# Patient Record
Sex: Male | Born: 1970 | Race: White | Hispanic: Yes | Smoking: Never smoker
Health system: Southern US, Community
[De-identification: ages and names within clinical notes are randomized; demographics above are authoritative.]

## PROBLEM LIST (undated history)

## (undated) DIAGNOSIS — J309 Allergic rhinitis, unspecified: Secondary | ICD-10-CM

## (undated) DIAGNOSIS — R011 Cardiac murmur, unspecified: Secondary | ICD-10-CM

## (undated) DIAGNOSIS — Z8719 Personal history of other diseases of the digestive system: Secondary | ICD-10-CM

## (undated) DIAGNOSIS — R7303 Prediabetes: Secondary | ICD-10-CM

## (undated) HISTORY — PX: NO PAST SURGERIES: SHX2092

---

## 2012-03-30 ENCOUNTER — Emergency Department: Payer: Self-pay | Admitting: Emergency Medicine

## 2012-03-30 LAB — CBC WITH DIFFERENTIAL/PLATELET
Basophil #: 0.1 10*3/uL (ref 0.0–0.1)
Basophil %: 0.7 %
Eosinophil #: 0 10*3/uL (ref 0.0–0.7)
HCT: 48.1 % (ref 40.0–52.0)
HGB: 16.3 g/dL (ref 13.0–18.0)
Lymphocyte %: 8.7 %
MCHC: 33.9 g/dL (ref 32.0–36.0)
MCV: 84 fL (ref 80–100)
Monocyte #: 0.6 x10 3/mm (ref 0.2–1.0)
Neutrophil %: 86.3 %
Platelet: 300 10*3/uL (ref 150–440)
RBC: 5.74 10*6/uL (ref 4.40–5.90)
RDW: 14.2 % (ref 11.5–14.5)

## 2012-03-30 LAB — COMPREHENSIVE METABOLIC PANEL
Alkaline Phosphatase: 89 U/L (ref 50–136)
BUN: 13 mg/dL (ref 7–18)
Bilirubin,Total: 0.6 mg/dL (ref 0.2–1.0)
Calcium, Total: 8.5 mg/dL (ref 8.5–10.1)
Chloride: 104 mmol/L (ref 98–107)
Co2: 24 mmol/L (ref 21–32)
Creatinine: 0.79 mg/dL (ref 0.60–1.30)
EGFR (African American): >60
EGFR (Non-African Amer.): >60
Glucose: 93 mg/dL (ref 65–99)
Osmolality: 275 (ref 275–301)
Potassium: 4 mmol/L (ref 3.5–5.1)
SGOT(AST): 26 U/L (ref 15–37)
SGPT (ALT): 53 U/L (ref 12–78)
Sodium: 138 mmol/L (ref 136–145)

## 2018-04-14 ENCOUNTER — Inpatient Hospital Stay
Admission: EM | Admit: 2018-04-14 | Discharge: 2018-04-17 | DRG: 392 | Disposition: A | Discharge status: Home / Self Care | Payer: BLUE CROSS/BLUE SHIELD | Attending: Surgery | Admitting: Surgery

## 2018-04-14 ENCOUNTER — Other Ambulatory Visit: Payer: Self-pay

## 2018-04-14 ENCOUNTER — Encounter: Payer: Self-pay | Admitting: Emergency Medicine

## 2018-04-14 DIAGNOSIS — K5720 Diverticulitis of large intestine with perforation and abscess without bleeding: Secondary | ICD-10-CM | POA: Diagnosis not present

## 2018-04-14 DIAGNOSIS — R1032 Left lower quadrant pain: Secondary | ICD-10-CM | POA: Diagnosis not present

## 2018-04-14 DIAGNOSIS — K5792 Diverticulitis of intestine, part unspecified, without perforation or abscess without bleeding: Secondary | ICD-10-CM

## 2018-04-14 LAB — URINALYSIS, COMPLETE (UACMP) WITH MICROSCOPIC
BILIRUBIN URINE: NEGATIVE
Bacteria, UA: NONE SEEN
Glucose, UA: 50 mg/dL — AB
Hgb urine dipstick: NEGATIVE
Ketones, ur: NEGATIVE mg/dL
Leukocytes,Ua: NEGATIVE
Nitrite: NEGATIVE
Protein, ur: NEGATIVE mg/dL
Specific Gravity, Urine: 1.028 (ref 1.005–1.030)
pH: 5 (ref 5.0–8.0)

## 2018-04-14 LAB — CBC
HCT: 44 % (ref 39.0–52.0)
HEMOGLOBIN: 15.1 g/dL (ref 13.0–17.0)
MCH: 28.7 pg (ref 26.0–34.0)
MCHC: 34.3 g/dL (ref 30.0–36.0)
MCV: 83.7 fL (ref 80.0–100.0)
Platelets: 299 10*3/uL (ref 150–400)
RBC: 5.26 MIL/uL (ref 4.22–5.81)
RDW: 11.9 % (ref 11.5–15.5)
WBC: 9.1 10*3/uL (ref 4.0–10.5)
nRBC: 0 % (ref 0.0–0.2)

## 2018-04-14 LAB — COMPREHENSIVE METABOLIC PANEL
ALBUMIN: 4.1 g/dL (ref 3.5–5.0)
ALT: 26 U/L (ref 0–44)
AST: 22 U/L (ref 15–41)
Alkaline Phosphatase: 85 U/L (ref 38–126)
Anion gap: 8 (ref 5–15)
BUN: 14 mg/dL (ref 6–20)
CHLORIDE: 107 mmol/L (ref 98–111)
CO2: 24 mmol/L (ref 22–32)
Calcium: 8.9 mg/dL (ref 8.9–10.3)
Creatinine, Ser: 0.88 mg/dL (ref 0.61–1.24)
GFR calc Af Amer: >60 mL/min (ref >60)
GFR calc non Af Amer: >60 mL/min (ref >60)
Glucose, Bld: 128 mg/dL — ABNORMAL HIGH (ref 70–99)
POTASSIUM: 3.6 mmol/L (ref 3.5–5.1)
Sodium: 139 mmol/L (ref 135–145)
Total Bilirubin: 0.2 mg/dL — ABNORMAL LOW (ref 0.3–1.2)
Total Protein: 7.3 g/dL (ref 6.5–8.1)

## 2018-04-14 LAB — LIPASE, BLOOD: Lipase: 25 U/L (ref 11–51)

## 2018-04-14 MED ORDER — SODIUM CHLORIDE 0.9% FLUSH: 3.0000 mL | Freq: Once | INTRAVENOUS | Status: DC

## 2018-04-14 NOTE — ED Triage Notes (Signed)
Pt reports went to urgent for abdominal pain LLQ since last Saturday, reports feeling burning sensation and at times has severe abdominal cramping to LLQ. Pt reports has had diarrhea since last Friday denies any nausea vomiting, pt talks in complete sentences no distress noted.

## 2018-04-15 ENCOUNTER — Encounter: Payer: Self-pay | Admitting: Emergency Medicine

## 2018-04-15 ENCOUNTER — Emergency Department: Payer: BLUE CROSS/BLUE SHIELD

## 2018-04-15 DIAGNOSIS — K5720 Diverticulitis of large intestine with perforation and abscess without bleeding: Secondary | ICD-10-CM | POA: Diagnosis present

## 2018-04-15 DIAGNOSIS — R1032 Left lower quadrant pain: Secondary | ICD-10-CM | POA: Diagnosis present

## 2018-04-15 MED ORDER — METRONIDAZOLE IN NACL 5-0.79 MG/ML-% IV SOLN
500.0000 mg | Freq: Three times a day (TID) | INTRAVENOUS | Status: DC
  Administered 2018-04-15 – 2018-04-17 (×7): 500 mg via INTRAVENOUS
  Filled 2018-04-15 (×8): qty 100

## 2018-04-15 MED ORDER — OXYCODONE HCL 5 MG PO TABS: 5.0000 mg | ORAL_TABLET | ORAL | Status: DC | PRN

## 2018-04-15 MED ORDER — SODIUM CHLORIDE 0.9 % IV SOLN: 1.0000 g | INTRAVENOUS | Status: DC

## 2018-04-15 MED ORDER — ENOXAPARIN SODIUM 40 MG/0.4ML ~~LOC~~ SOLN
40.0000 mg | SUBCUTANEOUS | Status: DC
  Administered 2018-04-15 – 2018-04-16 (×2): 40 mg via SUBCUTANEOUS
  Filled 2018-04-15 (×2): qty 0.4

## 2018-04-15 MED ORDER — ONDANSETRON HCL 4 MG/2ML IJ SOLN: 4.0000 mg | Freq: Four times a day (QID) | INTRAMUSCULAR | Status: DC | PRN

## 2018-04-15 MED ORDER — IOHEXOL 300 MG/ML  SOLN
100.0000 mL | Freq: Once | INTRAMUSCULAR | Status: AC | PRN
  Administered 2018-04-15: 100 mL via INTRAVENOUS

## 2018-04-15 MED ORDER — SODIUM CHLORIDE 0.9 % IV SOLN
2.0000 g | INTRAVENOUS | Status: DC
  Administered 2018-04-16 – 2018-04-17 (×2): 2 g via INTRAVENOUS
  Filled 2018-04-15 (×2): qty 2

## 2018-04-15 MED ORDER — MORPHINE SULFATE (PF) 2 MG/ML IV SOLN: 2.0000 mg | INTRAVENOUS | Status: DC | PRN

## 2018-04-15 MED ORDER — ACETAMINOPHEN 650 MG RE SUPP: 650.0000 mg | Freq: Four times a day (QID) | RECTAL | Status: DC | PRN

## 2018-04-15 MED ORDER — PIPERACILLIN-TAZOBACTAM 3.375 G IVPB 30 MIN
3.3750 g | Freq: Once | INTRAVENOUS | Status: AC
  Administered 2018-04-15: 3.375 g via INTRAVENOUS
  Filled 2018-04-15: qty 50

## 2018-04-15 MED ORDER — ONDANSETRON 4 MG PO TBDP: 4.0000 mg | ORAL_TABLET | Freq: Four times a day (QID) | ORAL | Status: DC | PRN

## 2018-04-15 MED ORDER — ACETAMINOPHEN 325 MG PO TABS: 650.0000 mg | ORAL_TABLET | Freq: Four times a day (QID) | ORAL | Status: DC | PRN

## 2018-04-15 MED ORDER — DEXTROSE IN LACTATED RINGERS 5 % IV SOLN
INTRAVENOUS | Status: DC
  Administered 2018-04-15 – 2018-04-16 (×2): via INTRAVENOUS

## 2018-04-15 NOTE — H&P (Signed)
SURGICAL HISTORY & PHYSICAL (cpt 99222)  HISTORY OF PRESENT ILLNESS (HPI): (obtained with the assistance of certified medical Spanish-English translator) 47 y.o. male presented to [facility] ED overnight for abdominal pain. Patient reports he experienced acute onset of severe LLQ abdominal pain 1 week ago, this past Friday 3/13, which continued with nausea, though no emesis, until Sunday 3/15. He says he'd experienced similar this past December, which ultimately resolved without intervention, so he again waited for it to resolve similarly this time as well. By Monday, his severe pain and nausea resolved until he then began to develop a different sensation of LLQ and supra-pubic abdominal "burning" with new onset of diarrhea beginning this past Wednesday 3/18. He says the "burning" was not as bad as his initial LLQ abdominal pain, but both his "burning" and diarrhea continued to worsen instead of resolving, prompting him to seek evaluation by urgent care, who referred him to [facility] ED. Patient denies fever, emesis, blood per rectum, or unintentional weight loss and says his diarrhea and pain have improved this morning. Patient adds that he also experienced a third similar episode of sigmoid colonic diverticulitis without abscess 5 years ago. He denies a history of constipation, denies a family history of colon cancer, and at <50 years old has not previously underwent screening colonoscopy.   PAST MEDICAL HISTORY (PMH):  History reviewed. No pertinent past medical history.  Reviewed. Otherwise negative.   PAST SURGICAL HISTORY (PSH):  History reviewed. No pertinent surgical history.  Reviewed. Otherwise negative.   MEDICATIONS:  Prior to Admission medications   Medication Sig Start Date End Date Taking? Authorizing Provider  benzonatate (TESSALON) 200 MG capsule Take 200 mg by mouth 3 (three) times daily as needed for cough. 04/14/18  Yes [provider]  brompheniramine-pseudoephedrine-DM 30-2-10  MG/5ML syrup Take 10 mLs by mouth 3 (three) times daily as needed for cough. 11/08/17  Yes [provider]   ALLERGIES:  Not on File   SOCIAL HISTORY:  Social History   Socioeconomic History  . Marital status: Unknown    Spouse name: Not on file  . Number of children: Not on file  . Years of education: Not on file  . Highest education level: Not on file  Occupational History  . Not on file  Social Needs  . Financial resource strain: Not on file  . Food insecurity:    Worry: Not on file    Inability: Not on file  . Transportation needs:    Medical: Not on file    Non-medical: Not on file  Tobacco Use  . Smoking status: Never Smoker  . Smokeless tobacco: Never Used  Substance and Sexual Activity  . Alcohol use: Yes    Alcohol/week: 4.0 standard drinks    Types: 4 Cans of beer per week    Comment: Weekly   . Drug use: Not on file  . Sexual activity: Not on file  Lifestyle  . Physical activity:    Days per week: Not on file    Minutes per session: Not on file  . Stress: Not on file  Relationships  . Social connections:    Talks on phone: Not on file    Gets together: Not on file    Attends religious service: Not on file    Active member of club or organization: Not on file    Attends meetings of clubs or organizations: Not on file    Relationship status: Not on file  . Intimate partner violence:      Fear of current or ex partner: Not on file    Emotionally abused: Not on file    Physically abused: Not on file    Forced sexual activity: Not on file  Other Topics Concern  . Not on file  Social History Narrative  . Not on file    The patient currently resides (home / rehab facility / nursing home): Home The patient normally is (ambulatory / bedbound): Ambulatory  FAMILY HISTORY:  History reviewed. No pertinent family history.  Otherwise negative.   REVIEW OF SYSTEMS:  Constitutional: denies any other weight loss, fever, chills, or sweats  Eyes:  denies any other vision changes, history of eye injury  ENT: denies sore throat, hearing problems  Respiratory: denies shortness of breath, wheezing  Cardiovascular: denies chest pain, palpitations  Gastrointestinal: abdominal pain, N/V, and bowel function as per HPI  Genitourinary: denies burning with urination or urinary frequency Musculoskeletal: denies any other joint pains or cramps  Skin: Denies any other rashes or skin discolorations  Neurological: denies any other headache, dizziness, weakness  Psychiatric: denies any other depression, anxiety   All other review of systems were otherwise negative.  VITAL SIGNS:  Temp:  [97.6 F (36.4 C)-98.5 F (36.9 C)] 97.8 F (36.6 C) (03/21 0858) Pulse Rate:  [51-64] 51 (03/21 0858) Resp:  [16-20] 16 (03/21 0858) BP: (111-146)/(74-101) 120/74 (03/21 0858) SpO2:  [97 %-100 %] 100 % (03/21 0858) Weight:  [70.3 kg] 70.3 kg (03/20 2002)     Height: 5' (152.4 cm) Weight: 70.3 kg BMI (Calculated): 30.27   INTAKE/OUTPUT:  This shift: No intake/output data recorded.  Last 2 shifts: @IOLAST2SHIFTS@  PHYSICAL EXAM:  Constitutional:  -- Normal body habitus  -- Awake, alert, and oriented x3, no apparent distress Eyes:  -- Pupils equally round and reactive to light  -- No scleral icterus, B/L no occular discharge Ear, nose, throat: -- Neck is FROM WNL -- No jugular venous distension  Pulmonary:  -- No wheezes or rhales -- Equal breath sounds bilaterally -- Breathing non-labored at rest Cardiovascular:  -- S1, S2 present  -- No pericardial rubs  Gastrointestinal:  -- Abdomen soft and non-distended with mild LLQ abdominal tenderness to palpation, no guarding or rebound tenderness -- No abdominal masses appreciated, pulsatile or otherwise  Musculoskeletal and Integumentary:  -- Wounds or skin discoloration: None appreciated -- Extremities: B/L UE and LE FROM, hands and feet warm, no edema  Neurologic:  -- Motor function: Intact and  symmetric -- Sensation: Intact and symmetric Psychiatric:  -- Mood and affect WNL  Labs:  CBC Latest Ref Rng & Units 04/14/2018 03/30/2012  WBC 4.0 - 10.5 K/uL 9.1 15.4(H)  Hemoglobin 13.0 - 17.0 g/dL 15.1 16.3  Hematocrit 39.0 - 52.0 % 44.0 48.1  Platelets 150 - 400 K/uL 299 300   CMP Latest Ref Rng & Units 04/14/2018 03/30/2012  Glucose 70 - 99 mg/dL 128(H) 93  BUN 6 - 20 mg/dL 14 13  Creatinine 0.61 - 1.24 mg/dL 0.88 0.79  Sodium 135 - 145 mmol/L 139 138  Potassium 3.5 - 5.1 mmol/L 3.6 4.0  Chloride 98 - 111 mmol/L 107 104  CO2 22 - 32 mmol/L 24 24  Calcium 8.9 - 10.3 mg/dL 8.9 8.5  Total Protein 6.5 - 8.1 g/dL 7.3 7.7  Total Bilirubin 0.3 - 1.2 mg/dL 0.2(L) 0.6  Alkaline Phos 38 - 126 U/L 85 89  AST 15 - 41 U/L 22 26  ALT 0 - 44 U/L 26 53   Imaging   studies:  CT Abdomen and Pelvis with Contrast (04/15/2018) - personally reviewed and discussed with patient and his daughter Normal appendix. Diverticulosis of sigmoid colon with sigmoid wall thickening and pericolic inflammatory changes compatible with acute diverticulitis. Small fluid collection measuring 9 x 15 x 8 mm mm in size with surrounding wall thickening and enhancement identified adjacent to the sigmoid colon and extending to the dome of the urinary bladder consistent with small diverticular abscess. Associated wall thickening of the adjacent bladder dome. No additional extraluminal fluid collections. No extraluminal gas or free intraperitoneal air. Stomach and remaining bowel loops unremarkable.  Small LEFT inguinal hernia containing fat. No free air or free fluid. Tiny umbilical hernia containing fat.  Assessment/Plan: (ICD-10's: K57.20) 47 y.o. 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               - clear liquids diet, IV fluids             - pain control prn, minimize narcotics              - IV antibiotics  (continue ceftriaxone and metronidazole)             - when tolerating PO, will need to maintain hydration + initially low fiber x 6 weeks, then high fiber diet             - elective outpatient elective sigmoid colectomy discussed considering multiple recurrences and no modifiable risk factors (constipation) to reduce risk for subsequent episodes of sigmoid colonic diverticulitis             - possibility of surgery with partial colectomy and likely colostomy also discussed if doesn't improve/resolve with antibiotics             - will admit to surgical service and continue to monitor abdominal exam and bowel function  - anticipate advancement of diet tomorrow and likely discharge home Monday, 3/23             - DVT prophylaxis, ambulation encouraged  All of the above findings and recommendations were discussed with the patient and his daughter, and all of his and family's questions were answered to their expressed satisfaction via certified medical Spanish-English translator service.  -- Jason E. Davis, MD, RPVI [place]: [facility] General Surgery - Partnering for exceptional care. Office: [phone] 

## 2018-04-15 NOTE — ED Provider Notes (Signed)
[facility] Emergency Department Provider Note  ____________________________________________   First MD Initiated Contact with Patient 04/15/18 0127     (approximate)  I have reviewed the triage vital signs and the nursing notes.   HISTORY  Chief Complaint Abdominal Pain (LLQ)  The patient and/or family speak(s) Spanish.  They understand they have the right to the use of a hospital interpreter, however at this time they prefer to speak directly with me in Spanish.  They know that they can ask for an interpreter at any time.   HPI Jeffrey Charles is a 48 y.o. male who reports no chronic medical issues and takes no medications every day presents for evaluation of lower abdominal pain and diarrhea.  He reports that the pain started last week and felt like a burning left lower quadrant abdominal pain that was occasionally sharp.  This continued for a couple days but then completely resolved for several days.  The pain started up again about 3 days ago and has been more or less constant.  Nothing in particular makes it better or worse but the pain does come and go.  Now it mostly feels like a burning sensation and "inflammation" all throughout his lower abdomen.  He has no upper abdominal pain.  He has had no nausea nor vomiting but has been having a lot of loose nonbloody stools.  He denies fever/chills, chest pain, shortness of breath.  He reports a history of "gastritis" but he takes no medications.  He drinks a few beers on the weekend and says that the pain does seem to be worse after drinking but it is all in his lower abdomen.  He has not been drinking heavily recently.  He does not smoke.  He was referred here from urgent care for further evaluation.         History reviewed. No pertinent past medical history.  There are no active problems to display for this patient.   History reviewed. No pertinent surgical history.  Prior to Admission  medications   Medication Sig Start Date End Date Taking? Authorizing Provider  benzonatate (TESSALON) 200 MG capsule Take 200 mg by mouth 3 (three) times daily as needed for cough. 04/14/18  Yes [provider]  brompheniramine-pseudoephedrine-DM 30-2-10 MG/5ML syrup Take 10 mLs by mouth 3 (three) times daily as needed for cough. 11/08/17  Yes [provider]    Allergies Patient has no allergy information on record.  History reviewed. No pertinent family history.  Social History Social History   Tobacco Use  . Smoking status: Never Smoker  . Smokeless tobacco: Never Used  Substance Use Topics  . Alcohol use: Yes    Alcohol/week: 4.0 standard drinks    Types: 4 Cans of beer per week    Comment: Weekly   . Drug use: Not on file    Review of Systems Constitutional: No fever/chills Eyes: No visual changes. ENT: No sore throat. Cardiovascular: Denies chest pain. Respiratory: Denies shortness of breath. Gastrointestinal: Lower abdominal pain and diarrhea as described above.  No nausea nor vomiting. Genitourinary: Negative for dysuria. Musculoskeletal: Negative for neck pain.  Negative for back pain. Integumentary: Negative for rash. Neurological: Negative for headaches, focal weakness or numbness.   ____________________________________________   PHYSICAL EXAM:  VITAL SIGNS: ED Triage Vitals [04/14/18 2002]  Enc Vitals Group     BP (!) 146/101     Pulse Rate 64     Resp 20     Temp 98.5  F (36.9 C)     Temp Source Oral     SpO2 98 %     Weight 70.3 kg (155 lb)     Height 1.524 m (5')     Head Circumference      Peak Flow      Pain Score 3     Pain Loc      Pain Edu?      Excl. in GC?     Constitutional: Alert and oriented. Well appearing and in no acute distress. Eyes: Conjunctivae are normal.  Head: Atraumatic. Nose: No congestion/rhinnorhea. Mouth/Throat: Mucous membranes are moist. Neck: No stridor.  No meningeal signs.    Cardiovascular: Normal rate, regular rhythm. Good peripheral circulation. Grossly normal heart sounds. Respiratory: Normal respiratory effort.  No retractions. Lungs CTAB. Gastrointestinal: Soft and non-distended.  Mild tenderness to palpation of the left lower quadrant.  No peritonitis. Musculoskeletal: No lower extremity tenderness nor edema. No gross deformities of extremities. Neurologic:  Normal speech and language. No gross focal neurologic deficits are appreciated.  Skin:  Skin is warm, dry and intact. No rash noted. Psychiatric: Mood and affect are normal. Speech and behavior are normal.  ____________________________________________   LABS (all labs ordered are listed, but only abnormal results are displayed)  Labs Reviewed  COMPREHENSIVE METABOLIC PANEL - Abnormal; Notable for the following components:      Result Value   Glucose, Bld 128 (*)    Total Bilirubin 0.2 (*)    All other components within normal limits  URINALYSIS, COMPLETE (UACMP) WITH MICROSCOPIC - Abnormal; Notable for the following components:   Color, Urine YELLOW (*)    APPearance CLEAR (*)    Glucose, UA 50 (*)    All other components within normal limits  LIPASE, BLOOD  CBC   ____________________________________________  EKG  No indication for EKG ____________________________________________  RADIOLOGY   ED MD interpretation: Acute diverticulitis with a small diverticular abscess  Official radiology report(s): Ct Abdomen Pelvis W Contrast  Result Date: 04/15/2018 CLINICAL DATA:  LEFT lower quadrant pain since last Saturday, burning sensation, severe cramping at times, diarrhea since last Friday EXAM: CT ABDOMEN AND PELVIS WITH CONTRAST TECHNIQUE: Multidetector CT imaging of the abdomen and pelvis was performed using the standard protocol following bolus administration of intravenous contrast. Sagittal and coronal MPR images reconstructed from axial data set. CONTRAST:  OMNIPAQUE IOHEXOL 300  MG/ML SOLN IV. No oral contrast. COMPARISON:  03/30/2012 FINDINGS: Lower chest: Calcified granuloma laterally at RIGHT lung base. Lung bases otherwise clear. Hepatobiliary: Mild fatty infiltration of liver. Liver and gallbladder otherwise normal appearance. Pancreas: Normal appearance Spleen: Normal appearance Adrenals/Urinary Tract: Tiny cyst inferior pole RIGHT kidney. Adrenal glands, kidneys, ureters, and bladder normal appearance Stomach/Bowel: Normal appendix. Diverticulosis of sigmoid colon with sigmoid wall thickening and pericolic inflammatory changes compatible with acute diverticulitis. Small fluid collection measuring 9 x 15 x 8 mm mm in size with surrounding wall thickening and enhancement identified adjacent to the sigmoid colon and extending to the dome of the urinary bladder consistent with small diverticular abscess. Associated wall thickening of the adjacent bladder dome. No additional extraluminal fluid collections. No extraluminal gas or free intraperitoneal air. Stomach and remaining bowel loops unremarkable. Vascular/Lymphatic: Aorta normal caliber.  No adenopathy. Reproductive: Minimal prostatic enlargement. Other: Small LEFT inguinal hernia containing fat. No free air or free fluid. Tiny umbilical hernia containing fat. Musculoskeletal: No acute osseous findings. IMPRESSION: Acute sigmoid diverticulitis with a small diverticular abscess collection 9 x 15 x 8  mm in size located between the sigmoid colon and the dome of the urinary bladder. No free intraperitoneal air. Small LEFT inguinal and tiny umbilical hernias containing fat. Electronically Signed   By: Ulyses Southward M.D.   On: 04/15/2018 04:01    ____________________________________________   PROCEDURES   Procedure(s) performed (including Critical Care):  Procedures   ____________________________________________   INITIAL IMPRESSION / MDM / ASSESSMENT AND PLAN / ED COURSE  As part of my medical decision making, I reviewed  the following data within the electronic MEDICAL RECORD NUMBER Nursing notes reviewed and incorporated, Labs reviewed , Old chart reviewed, A consult was requested and obtained from this/these consultant(s) Surgery (Dr. Earlene Plater) and Notes from prior ED visits         Differential diagnosis includes, but is not limited to, diverticulitis, viral infection leading to persistent diarrhea, appendicitis, less likely gallbladder disease.  The patient is not having any vomiting so he does not have any gastritis.  Pancreatitis is unlikely given the lack of upper abdominal pain and nausea/vomiting.  The patient's lab work is reassuring with a normal conference of metabolic panel and CBC.  Lipase is normal.  Urinalysis demonstrates a small amount of glucosuria but is otherwise normal.  His vital signs are stable and he is afebrile.  Given his symptoms I will check a CT scan with IV contrast for any signs of diverticulitis or other acute intra-abdominal infection but anticipate it may most likely show evidence of a diarrheal illness but without any acute or emergent medical condition.  I anticipate discharge and outpatient follow-up.  Clinical Course as of Apr 15 435  Sat Apr 15, 2018  [id number] The patient's CT scan is consistent with acute diverticulitis with a small diverticular abscess.  I called and spoke by phone with Dr. Earlene Plater with general surgery.  He will consult on the patient in the morning and he is putting in admission orders in the meantime.  He agreed with my plan for Zosyn 3.375 g IV and for the patient to remain n.p.o.  I have also ordered normal saline 100 mL/h for hydration.  The patient is currently in no distress and is not requiring pain medicine or nausea medicine.  CT ABDOMEN PELVIS W CONTRAST [CF]    Clinical Course User Index [CF] Loleta Rose, MD    ____________________________________________  FINAL CLINICAL IMPRESSION(S) / ED DIAGNOSES  Final diagnoses:  Acute diverticulitis  Colonic  diverticular abscess     MEDICATIONS GIVEN DURING THIS VISIT:  Medications  sodium chloride flush (NS) 0.9 % injection 3 mL (has no administration in time range)  piperacillin-tazobactam (ZOSYN) IVPB 3.375 g (has no administration in time range)  iohexol (OMNIPAQUE) 300 MG/ML solution 100 mL (100 mLs Intravenous Contrast Given 04/15/18 0314)     ED Discharge Orders    None       Note:  This document was prepared using Dragon voice recognition software and may include unintentional dictation errors.   Loleta Rose, MD 04/15/18 [phone]

## 2018-04-15 NOTE — Progress Notes (Addendum)
Patient discussed with Dr. York Cerise, and his chart, including patient's labs and CT abdomen and pelvis with contrast, were personally reviewed and demonstrate acute recurrent sigmoid colonic diverticulitis with pelvic/peri-colonic abscess too small for drainage and no leukocytosis. Will admit to surgical service with IV antibiotics, prn pain control, and NPO for now. Full H&P to follow.  Please call if any questions or concerns.  -- Scherrie Gerlach Earlene Plater, MD, RPVI [place]: [facility] General Surgery - Partnering for exceptional care. Office: [phone]

## 2018-04-15 NOTE — Plan of Care (Signed)
  Problem: Urinary Elimination: Goal: Signs and symptoms of infection will decrease Outcome: Progressing   

## 2018-04-15 NOTE — ED Notes (Signed)
Patient transported to CT 

## 2018-04-15 NOTE — ED Notes (Signed)
Pt assisted to bedside commode.  Pt with steady gait. This RN will continue to monitor.

## 2018-04-15 NOTE — Progress Notes (Signed)
SURGICAL HISTORY & PHYSICAL (cpt [phone])  HISTORY OF PRESENT ILLNESS (HPI): (obtained with the assistance of certified medical Spanish-English translator) 48 y.o. male presented to [facility] ED overnight for abdominal pain. Patient reports he experienced acute onset of severe LLQ abdominal pain 1 week ago, this past Friday 3/13, which continued with nausea, though no emesis, until Sunday 3/15. He says he'd experienced similar this past December, which ultimately resolved without intervention, so he again waited for it to resolve similarly this time as well. By Monday, his severe pain and nausea resolved until he then began to develop a different sensation of LLQ and supra-pubic abdominal "burning" with new onset of diarrhea beginning this past Wednesday 3/18. He says the "burning" was not as bad as his initial LLQ abdominal pain, but both his "burning" and diarrhea continued to worsen instead of resolving, prompting him to seek evaluation by urgent care, who referred him to [facility] ED. Patient denies fever, emesis, blood per rectum, or unintentional weight loss and says his diarrhea and pain have improved this morning. Patient adds that he also experienced a third similar episode of sigmoid colonic diverticulitis without abscess 5 years ago. He denies a history of constipation, denies a family history of colon cancer, and at <54 years old has not previously underwent screening colonoscopy.   PAST MEDICAL HISTORY (PMH):  History reviewed. No pertinent past medical history.  Reviewed. Otherwise negative.   PAST SURGICAL HISTORY (PSH):  History reviewed. No pertinent surgical history.  Reviewed. Otherwise negative.   MEDICATIONS:  Prior to Admission medications   Medication Sig Start Date End Date Taking? Authorizing Provider  benzonatate (TESSALON) 200 MG capsule Take 200 mg by mouth 3 (three) times daily as needed for cough. 04/14/18  Yes [provider]  brompheniramine-pseudoephedrine-DM 30-2-10  MG/5ML syrup Take 10 mLs by mouth 3 (three) times daily as needed for cough. 11/08/17  Yes [provider]   ALLERGIES:  Not on File   SOCIAL HISTORY:  Social History   Socioeconomic History  . Marital status: Unknown    Spouse name: Not on file  . Number of children: Not on file  . Years of education: Not on file  . Highest education level: Not on file  Occupational History  . Not on file  Social Needs  . Financial resource strain: Not on file  . Food insecurity:    Worry: Not on file    Inability: Not on file  . Transportation needs:    Medical: Not on file    Non-medical: Not on file  Tobacco Use  . Smoking status: Never Smoker  . Smokeless tobacco: Never Used  Substance and Sexual Activity  . Alcohol use: Yes    Alcohol/week: 4.0 standard drinks    Types: 4 Cans of beer per week    Comment: Weekly   . Drug use: Not on file  . Sexual activity: Not on file  Lifestyle  . Physical activity:    Days per week: Not on file    Minutes per session: Not on file  . Stress: Not on file  Relationships  . Social connections:    Talks on phone: Not on file    Gets together: Not on file    Attends religious service: Not on file    Active member of club or organization: Not on file    Attends meetings of clubs or organizations: Not on file    Relationship status: Not on file  . Intimate partner violence:  Fear of current or ex partner: Not on file    Emotionally abused: Not on file    Physically abused: Not on file    Forced sexual activity: Not on file  Other Topics Concern  . Not on file  Social History Narrative  . Not on file    The patient currently resides (home / rehab facility / nursing home): Home The patient normally is (ambulatory / bedbound): Ambulatory  FAMILY HISTORY:  History reviewed. No pertinent family history.  Otherwise negative.   REVIEW OF SYSTEMS:  Constitutional: denies any other weight loss, fever, chills, or sweats  Eyes:  denies any other vision changes, history of eye injury  ENT: denies sore throat, hearing problems  Respiratory: denies shortness of breath, wheezing  Cardiovascular: denies chest pain, palpitations  Gastrointestinal: abdominal pain, N/V, and bowel function as per HPI  Genitourinary: denies burning with urination or urinary frequency Musculoskeletal: denies any other joint pains or cramps  Skin: Denies any other rashes or skin discolorations  Neurological: denies any other headache, dizziness, weakness  Psychiatric: denies any other depression, anxiety   All other review of systems were otherwise negative.  VITAL SIGNS:  Temp:  [97.6 F (36.4 C)-98.5 F (36.9 C)] 97.8 F (36.6 C) (03/21 0858) Pulse Rate:  [51-64] 51 (03/21 0858) Resp:  [16-20] 16 (03/21 0858) BP: (111-146)/(74-101) 120/74 (03/21 0858) SpO2:  [97 %-100 %] 100 % (03/21 0858) Weight:  [70.3 kg] 70.3 kg (03/20 2002)     Height: 5' (152.4 cm) Weight: 70.3 kg BMI (Calculated): 30.27   INTAKE/OUTPUT:  This shift: No intake/output data recorded.  Last 2 shifts: @IOLAST2SHIFTS @  PHYSICAL EXAM:  Constitutional:  -- Normal body habitus  -- Awake, alert, and oriented x3, no apparent distress Eyes:  -- Pupils equally round and reactive to light  -- No scleral icterus, B/L no occular discharge Ear, nose, throat: -- Neck is FROM WNL -- No jugular venous distension  Pulmonary:  -- No wheezes or rhales -- Equal breath sounds bilaterally -- Breathing non-labored at rest Cardiovascular:  -- S1, S2 present  -- No pericardial rubs  Gastrointestinal:  -- Abdomen soft and non-distended with mild LLQ abdominal tenderness to palpation, no guarding or rebound tenderness -- No abdominal masses appreciated, pulsatile or otherwise  Musculoskeletal and Integumentary:  -- Wounds or skin discoloration: None appreciated -- Extremities: B/L UE and LE FROM, hands and feet warm, no edema  Neurologic:  -- Motor function: Intact and  symmetric -- Sensation: Intact and symmetric Psychiatric:  -- Mood and affect WNL  Labs:  CBC Latest Ref Rng & Units 04/14/2018 03/30/2012  WBC 4.0 - 10.5 K/uL 9.1 15.4(H)  Hemoglobin 13.0 - 17.0 g/dL 41.9 37.9  Hematocrit 02.4 - 52.0 % 44.0 48.1  Platelets 150 - 400 K/uL 299 300   CMP Latest Ref Rng & Units 04/14/2018 03/30/2012  Glucose 70 - 99 mg/dL 097(D) 93  BUN 6 - 20 mg/dL 14 13  Creatinine 5.32 - 1.24 mg/dL 9.92 4.26  Sodium 834 - 145 mmol/L 139 138  Potassium 3.5 - 5.1 mmol/L 3.6 4.0  Chloride 98 - 111 mmol/L 107 104  CO2 22 - 32 mmol/L 24 24  Calcium 8.9 - 10.3 mg/dL 8.9 8.5  Total Protein 6.5 - 8.1 g/dL 7.3 7.7  Total Bilirubin 0.3 - 1.2 mg/dL 1.9(Q) 0.6  Alkaline Phos 38 - 126 U/L 85 89  AST 15 - 41 U/L 22 26  ALT 0 - 44 U/L 26 53   Imaging  studies:  CT Abdomen and Pelvis with Contrast (04/15/2018) - personally reviewed and discussed with patient and his daughter Normal appendix. Diverticulosis of sigmoid colon with sigmoid wall thickening and pericolic inflammatory changes compatible with acute diverticulitis. Small fluid collection measuring 9 x 15 x 8 mm mm in size with surrounding wall thickening and enhancement identified adjacent to the sigmoid colon and extending to the dome of the urinary bladder consistent with small diverticular abscess. Associated wall thickening of the adjacent bladder dome. No additional extraluminal fluid collections. No extraluminal gas or free intraperitoneal air. Stomach and remaining bowel loops unremarkable.  Small LEFT inguinal hernia containing fat. No free air or free fluid. Tiny umbilical hernia containing fat.  Assessment/Plan: (ICD-10's: K59.20) 48 y.o. 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               - clear liquids diet, IV fluids             - pain control prn, minimize narcotics              - IV antibiotics  (continue ceftriaxone and metronidazole)             - when tolerating PO, will need to maintain hydration + initially low fiber x 6 weeks, then high fiber diet             - elective outpatient elective sigmoid colectomy discussed considering multiple recurrences and no modifiable risk factors (constipation) to reduce risk for subsequent episodes of sigmoid colonic diverticulitis             - possibility of surgery with partial colectomy and likely colostomy also discussed if doesn't improve/resolve with antibiotics             - will admit to surgical service and continue to monitor abdominal exam and bowel function  - anticipate advancement of diet tomorrow and likely discharge home Monday, 3/23             - DVT prophylaxis, ambulation encouraged  All of the above findings and recommendations were discussed with the patient and his daughter, and all of his and family's questions were answered to their expressed satisfaction via certified medical Spanish-English translator service.  -- Scherrie Gerlach Earlene Plater, MD, RPVI [place]: [facility] General Surgery - Partnering for exceptional care. Office: [phone]

## 2018-04-15 NOTE — ED Notes (Signed)
.. ED TO INPATIENT HANDOFF REPORT  ED Nurse Name and Phone #: Pattricia Boss [phone]  S Name/Age/Gender Jeffrey Charles 48 y.o. male Room/Bed: ED25A/ED25A  Code Status   Code Status: Not on file  Home/SNF/Other Home Patient oriented to: self, place, time and situation Is this baseline? Yes   Triage Complete: Triage complete  Chief Complaint Abdominal Pain  Triage Note Pt reports went to urgent for abdominal pain LLQ since last Saturday, reports feeling burning sensation and at times has severe abdominal cramping to LLQ. Pt reports has had diarrhea since last Friday denies any nausea vomiting, pt talks in complete sentences no distress noted.    Allergies Not on File  Level of Care/Admitting Diagnosis ED Disposition    ED Disposition Condition Comment   Admit  The patient appears reasonably stabilized for admission considering the current resources, flow, and capabilities available in the ED at this time, and I doubt any other [facility] requiring further screening and/or treatment in the ED prior to admission is  present.       B Medical/Surgery History History reviewed. No pertinent past medical history. History reviewed. No pertinent surgical history.   A IV Location/Drains/Wounds Patient Lines/Drains/Airways Status   Active Line/Drains/Airways    Name:   Placement date:   Placement time:   Site:   Days:   Peripheral IV 04/15/18 Left Antecubital   04/15/18    0307    Antecubital   less than 1          Intake/Output Last 24 hours No intake or output data in the 24 hours ending 04/15/18 0456  Labs/Imaging Results for orders placed or performed during the hospital encounter of 04/14/18 (from the past 48 hour(s))  Lipase, blood     Status: None   Collection Time: 04/14/18  8:04 PM  Result Value Ref Range   Lipase 25 11 - 51 U/L    Comment: Performed at [facility], [address] [place], [place] [address]  Comprehensive metabolic panel     Status:  Abnormal   Collection Time: 04/14/18  8:04 PM  Result Value Ref Range   Sodium 139 135 - 145 mmol/L   Potassium 3.6 3.5 - 5.1 mmol/L   Chloride 107 98 - 111 mmol/L   CO2 24 22 - 32 mmol/L   Glucose, Bld 128 (H) 70 - 99 mg/dL   BUN 14 6 - 20 mg/dL   Creatinine, Ser 6.23 0.61 - 1.24 mg/dL   Calcium 8.9 8.9 - 76.2 mg/dL   Total Protein 7.3 6.5 - 8.1 g/dL   Albumin 4.1 3.5 - 5.0 g/dL   AST 22 15 - 41 U/L   ALT 26 0 - 44 U/L   Alkaline Phosphatase 85 38 - 126 U/L   Total Bilirubin 0.2 (L) 0.3 - 1.2 mg/dL   GFR calc non Af Amer >60 >60 mL/min   GFR calc Af Amer >60 >60 mL/min   Anion gap 8 5 - 15    Comment: Performed at [facility], [address] [place], [place] [address]  CBC     Status: None   Collection Time: 04/14/18  8:04 PM  Result Value Ref Range   WBC 9.1 4.0 - 10.5 K/uL   RBC 5.26 4.22 - 5.81 MIL/uL   Hemoglobin 15.1 13.0 - 17.0 g/dL   HCT 76.1 60.7 - 37.1 %   MCV 83.7 80.0 - 100.0 fL   MCH 28.7 26.0 - 34.0 pg   MCHC 34.3  30.0 - 36.0 g/dL   RDW 67.1 24.5 - 80.9 %   Platelets 299 150 - 400 K/uL   nRBC 0.0 0.0 - 0.2 %    Comment: Performed at [facility], [address] [place], [place] [address]  Urinalysis, Complete w Microscopic     Status: Abnormal   Collection Time: 04/14/18  8:04 PM  Result Value Ref Range   Color, Urine YELLOW (A) YELLOW   APPearance CLEAR (A) CLEAR   Specific Gravity, Urine 1.028 1.005 - 1.030   pH 5.0 5.0 - 8.0   Glucose, UA 50 (A) NEGATIVE mg/dL   Hgb urine dipstick NEGATIVE NEGATIVE   Bilirubin Urine NEGATIVE NEGATIVE   Ketones, ur NEGATIVE NEGATIVE mg/dL   Protein, ur NEGATIVE NEGATIVE mg/dL   Nitrite NEGATIVE NEGATIVE   Leukocytes,Ua NEGATIVE NEGATIVE   RBC / HPF 0-5 0 - 5 RBC/hpf   WBC, UA 0-5 0 - 5 WBC/hpf   Bacteria, UA NONE SEEN NONE SEEN   Squamous Epithelial / LPF 0-5 0 - 5   Mucus PRESENT     Comment: Performed at [facility], [address] [place], [place] [address]   Ct Abdomen  Pelvis W Contrast  Result Date: 04/15/2018 CLINICAL DATA:  LEFT lower quadrant pain since last Saturday, burning sensation, severe cramping at times, diarrhea since last Friday EXAM: CT ABDOMEN AND PELVIS WITH CONTRAST TECHNIQUE: Multidetector CT imaging of the abdomen and pelvis was performed using the standard protocol following bolus administration of intravenous contrast. Sagittal and coronal MPR images reconstructed from axial data set. CONTRAST:  OMNIPAQUE IOHEXOL 300 MG/ML SOLN IV. No oral contrast. COMPARISON:  03/30/2012 FINDINGS: Lower chest: Calcified granuloma laterally at RIGHT lung base. Lung bases otherwise clear. Hepatobiliary: Mild fatty infiltration of liver. Liver and gallbladder otherwise normal appearance. Pancreas: Normal appearance Spleen: Normal appearance Adrenals/Urinary Tract: Tiny cyst inferior pole RIGHT kidney. Adrenal glands, kidneys, ureters, and bladder normal appearance Stomach/Bowel: Normal appendix. Diverticulosis of sigmoid colon with sigmoid wall thickening and pericolic inflammatory changes compatible with acute diverticulitis. Small fluid collection measuring 9 x 15 x 8 mm mm in size with surrounding wall thickening and enhancement identified adjacent to the sigmoid colon and extending to the dome of the urinary bladder consistent with small diverticular abscess. Associated wall thickening of the adjacent bladder dome. No additional extraluminal fluid collections. No extraluminal gas or free intraperitoneal air. Stomach and remaining bowel loops unremarkable. Vascular/Lymphatic: Aorta normal caliber.  No adenopathy. Reproductive: Minimal prostatic enlargement. Other: Small LEFT inguinal hernia containing fat. No free air or free fluid. Tiny umbilical hernia containing fat. Musculoskeletal: No acute osseous findings. IMPRESSION: Acute sigmoid diverticulitis with a small diverticular abscess collection 9 x 15 x 8 mm in size located between the sigmoid colon and the dome  of the urinary bladder. No free intraperitoneal air. Small LEFT inguinal and tiny umbilical hernias containing fat. Electronically Signed   By: Ulyses Southward M.D.   On: 04/15/2018 04:01    Pending Labs Unresulted Labs (From admission, onward)   None      Vitals/Pain Today's Vitals   04/14/18 2002 04/15/18 0300 04/15/18 0400  BP: (!) 146/101 (!) 145/96 111/78  Pulse: 64 61 (!) 59  Resp: 20 17 17   Temp: 98.5 F (36.9 C)    TempSrc: Oral    SpO2: 98% 100% 97%  Weight: 70.3 kg    Height: 5' (1.524 m)    PainSc: 3       Isolation Precautions No active  isolations  Medications Medications  sodium chloride flush (NS) 0.9 % injection 3 mL (has no administration in time range)  piperacillin-tazobactam (ZOSYN) IVPB 3.375 g (3.375 g Intravenous New Bag/Given 04/15/18 0445)  iohexol (OMNIPAQUE) 300 MG/ML solution 100 mL (100 mLs Intravenous Contrast Given 04/15/18 0314)    Mobility walks Low fall risk   Focused Assessments Neuro Assessment Handoff:  Swallow screen pass? Yes          Neuro Assessment:   Neuro Checks:      Last Documented NIHSS Modified Score:   Has TPA been given? No If patient is a Neuro Trauma and patient is going to OR before floor call report to 4N Charge nurse: [phone] or [phone]     R Recommendations: See Admitting Provider Note  Report given to:   Additional Notes:

## 2018-04-16 LAB — CBC
HCT: 40.4 % (ref 39.0–52.0)
Hemoglobin: 13.8 g/dL (ref 13.0–17.0)
MCH: 28.9 pg (ref 26.0–34.0)
MCHC: 34.2 g/dL (ref 30.0–36.0)
MCV: 84.7 fL (ref 80.0–100.0)
Platelets: 252 10*3/uL (ref 150–400)
RBC: 4.77 MIL/uL (ref 4.22–5.81)
RDW: 12 % (ref 11.5–15.5)
WBC: 4.4 10*3/uL (ref 4.0–10.5)
nRBC: 0 % (ref 0.0–0.2)

## 2018-04-16 LAB — BASIC METABOLIC PANEL
Anion gap: 6 (ref 5–15)
BUN: 9 mg/dL (ref 6–20)
CO2: 25 mmol/L (ref 22–32)
Calcium: 8.5 mg/dL — ABNORMAL LOW (ref 8.9–10.3)
Chloride: 109 mmol/L (ref 98–111)
Creatinine, Ser: 0.73 mg/dL (ref 0.61–1.24)
GFR calc Af Amer: >60 mL/min (ref >60)
GFR calc non Af Amer: >60 mL/min (ref >60)
Glucose, Bld: 117 mg/dL — ABNORMAL HIGH (ref 70–99)
Potassium: 3.5 mmol/L (ref 3.5–5.1)
Sodium: 140 mmol/L (ref 135–145)

## 2018-04-16 NOTE — Progress Notes (Signed)
SURGICAL PROGRESS NOTE (cpt [phone])  Hospital Day(s): 1.   Post op day(s):  Marland Kitchen   Interval History: Patient seen and examined, no acute events or new complaints overnight. Patient reports improved mild persistent LLQ/suprapubic "burning" with +flatus and tolerating clear liquids diet. He denies fever, nausea, CP, or SOB and has been ambulating in his room.  Review of Systems:  Constitutional: denies fever, chills  HEENT: denies cough or congestion  Respiratory: denies any shortness of breath  Cardiovascular: denies chest pain or palpitations  Gastrointestinal: abdominal pain, N/V, and bowel function as per interval history Genitourinary: denies burning with urination or urinary frequency Musculoskeletal: denies pain, decreased motor or sensation Integumentary: denies any other rashes or skin discolorations Neurological: denies HA or vision/hearing changes   Vital signs in last 24 hours: [min-max] current  Temp:  [97.6 F (36.4 C)-98.4 F (36.9 C)] 97.6 F (36.4 C) (03/22 0448) Pulse Rate:  [48-60] 60 (03/22 0615) Resp:  [18] 18 (03/22 0448) BP: (91-116)/(65-75) 112/68 (03/22 0615) SpO2:  [100 %] 100 % (03/22 0448)     Height: 5' (152.4 cm) Weight: 70.3 kg BMI (Calculated): 30.27   Intake/Output this shift:  Total I/O In: 345.5 [I.V.:245.5; IV Piggyback:100] Out: -    Intake/Output last 2 shifts:  @IOLAST2SHIFTS @   Physical Exam:  Constitutional: alert, cooperative and no distress  HENT: normocephalic without obvious abnormality  Eyes: PERRL, EOM's grossly intact and symmetric  Respiratory: breathing non-labored at rest  Cardiovascular: regular rate and sinus rhythm  Gastrointestinal: soft, minimal LLQ/suprapubic tenderness to deep palpation, and non-distended Musculoskeletal: UE and LE FROM, no edema or wounds, motor and sensation grossly intact, NT   Labs:  CBC Latest Ref Rng & Units 04/16/2018 04/14/2018 03/30/2012  WBC 4.0 - 10.5 K/uL 4.4 9.1 15.4(H)  Hemoglobin 13.0  - 17.0 g/dL 76.2 83.1 51.7  Hematocrit 39.0 - 52.0 % 40.4 44.0 48.1  Platelets 150 - 400 K/uL 252 299 300   CMP Latest Ref Rng & Units 04/16/2018 04/14/2018 03/30/2012  Glucose 70 - 99 mg/dL 616(W) 737(T) 93  BUN 6 - 20 mg/dL 9 14 13   Creatinine 0.61 - 1.24 mg/dL 0.62 6.94 8.54  Sodium 135 - 145 mmol/L 140 139 138  Potassium 3.5 - 5.1 mmol/L 3.5 3.6 4.0  Chloride 98 - 111 mmol/L 109 107 104  CO2 22 - 32 mmol/L 25 24 24   Calcium 8.9 - 10.3 mg/dL 6.2(V) 8.9 8.5  Total Protein 6.5 - 8.1 g/dL - 7.3 7.7  Total Bilirubin 0.3 - 1.2 mg/dL - 0.3(J) 0.6  Alkaline Phos 38 - 126 U/L - 85 89  AST 15 - 41 U/L - 22 26  ALT 0 - 44 U/L - 26 53   Imaging studies: No new pertinent imaging studies   Assessment/Plan: (ICD-10's: K3.20) 48 y.o. 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   - advance diet as tolerated  - heplock IVF, pain control prn (minimize narcotics)  - IV antibiotics (continue ceftriaxone and metronidazole) - when tolerating PO, will need tomaintain hydration + initially low fiber x 6 weeks, then high fiber diet - elective outpatient elective sigmoid colectomy discussed considering multiple recurrences and no modifiable risk factors (constipation) to reduce risk for subsequent episodes of sigmoid colonic diverticulitis             - anticipate likely discharge home tomorrow - DVT prophylaxis, ambulation  All of the above findings and  recommendations were discussed with the patient, and all of patient's questions were answered to his expressed satisfaction.  -- Scherrie Gerlach Earlene Plater, MD, RPVI [place]: [facility] General Surgery - Partnering for exceptional care. Office: [phone]

## 2018-04-17 MED ORDER — METRONIDAZOLE 500 MG PO TABS: 500.0000 mg | ORAL_TABLET | Freq: Three times a day (TID) | ORAL | 0 refills | Status: AC

## 2018-04-17 MED ORDER — CIPROFLOXACIN HCL 500 MG PO TABS: 500.0000 mg | ORAL_TABLET | Freq: Two times a day (BID) | ORAL | 0 refills | Status: AC

## 2018-04-17 NOTE — Progress Notes (Signed)
Patient given discharge instructions in spanish and spanish interpretor used during discharge with nurse. All questions answered and patient verbalized understanding.   PIV x 1 removed and patient dressed himself. He was also instructed to have his ride [place] [facility] mall when they [place].

## 2018-04-17 NOTE — Discharge Instructions (Signed)
Diverticulitis  Diverticulitis    La diverticulitis ocurre cuando pequeos bolsillos que se han formado en el intestino grueso (colon) se infectan o se inflaman. Esto produce dolor de estmago y heces lquidas (diarrea).  Estas bolsas en el colon se denominan divertculos. Se forman en las personas que tienen una afeccin llamada diverticulitis.  Siga estas indicaciones en su casa:  Medicamentos   Tome los medicamentos de venta libre y los recetados solamente como se lo haya indicado el mdico. Estos incluyen los siguientes:  ? Antibiticos.  ? Analgsicos.  ? Pastillas de fibra.  ? Probiticos.  ? Laxantes.   No conduzca ni use maquinaria pesada mientras toma analgsicos recetados.   Si le recetaron un antibitico, tmelo como se lo hayan indicado. No deje de tomarlos aunque se sienta mejor.  Instrucciones generales     Siga la dieta como se lo haya indicado el mdico.   Cuando se sienta mejor, el mdico puede indicarle que cambie la dieta. Tal vez necesite ingerir gran cantidad de fibra. La fibra facilita la evacuacin intestinal (defecacin). Entre los alimentos saludables con fibra, se incluyen los siguientes:  ? Frutos rojos.  ? Frijoles.  ? Lentejas.  ? Verduras de hoja verde.   Haga ejercicios 3 o ms veces por semana. Hgalos durante 30 minutos cada vez. Ejerctese lo suficiente como para transpirar y acelerar los latidos cardacos.   Concurra a todas las visitas de control como se lo hayan indicado. Esto es importante. Puede que tenga que someterse a un examen del intestino grueso. Esto se denomina colonoscopia.  Comunquese con un mdico si:   El dolor no mejora.   Le cuesta mucho comer o beber.   No defeca como lo hace normalmente.  Solicite ayuda de inmediato si:   El dolor empeora.   Los problemas no mejoran.   Los problemas empeoran muy rpidamente.   Tiene fiebre.   Devuelve (vomita) ms de una vez.   Sus heces tienen las siguientes caractersticas:  ? Contienen sangre.  ? Son de color  negro.  ? Son alquitranadas.  Resumen   La diverticulitis ocurre cuando pequeos bolsillos que se han formado en el intestino grueso (colon) se infectan o se inflaman.   Tome los medicamentos solamente como se lo haya indicado el mdico.   Siga la dieta como se lo haya indicado el mdico.  Esta informacin no tiene como fin reemplazar el consejo del mdico. Asegrese de hacerle al mdico cualquier pregunta que tenga.  Document Released: 12/31/2010 Document Revised: 07/15/2016 Document Reviewed: 07/15/2016  Elsevier Interactive Patient Education  2019 [employer]

## 2018-04-17 NOTE — Discharge Summary (Signed)
[name] SURGICAL DISCHARGE SUMMARY (cpt: [phone])  Patient ID: Jeffrey Charles MRN: [id number] DOB/AGE: [birth year] 48 y.o.  Admit date: 04/14/2018 Discharge date: 04/17/2018  Discharge Diagnoses Patient Active Problem List   Diagnosis Date Noted  . Diverticulitis of large intestine with abscess without bleeding 04/15/2018    Consultants None  Procedures None  HPI: 48 [facility]. Patient reportshe experienced acute onset of severe LLQ abdominal pain1 week ago, this pastFriday 3/13, which continued with nausea, though no emesis, until Sunday 3/15. He says he'd experienced similar this past December, which ultimately resolved without intervention, so he again waited for it to resolve similarly this time as well. By Monday, his severe pain and nausea resolveduntilhe then began to develop a different sensation ofLLQ and supra-pubicabdominal "burning" with new onset ofdiarrhea beginning this pastWednesday 3/18. He says the "burning" wasnot as bad as his initialLLQ abdominal pain, butboth his"burning" and diarrhea continued to worsen instead of resolving, prompting him to seek evaluation by urgent care, who referred him to [facility] ED. Patientdenies fever, emesis, blood per rectum, or unintentional weight loss and says hisdiarrhea and pain have improved this morning. Patient adds that he also experienceda thirdsimilar episode of sigmoid colonic diverticulitis without abscess 5 years ago. He denies a history of constipation, denies afamilyhistoryof colon cancer, and at <28 years old has not previously underwent screening colonoscopy.  Hospital Course: The patient was admitted and started on IV ABx. The abscess is too small to be amenable to drain placement. Over the course of 3 hospital days the patient's pain improved/resolved and advancement of patient's diet and ambulation were well-tolerated. The remainder  of patient's hospital course was essentially unremarkable, and discharge planning was initiated accordingly with patient safely able to be discharged home with appropriate discharge instructions, antibiotics (Cipro + Flagyl x14 days), pain control, and outpatient follow-up to discuss elective outpatient sigmoid colectomy after all of his  questions were answered to his expressed satisfaction.  Discharge Condition: Good   Physical Examination:  Constitutional: Well appearing male, NAD Pulmonary: Normal effort, no respiratory distress Gastrointestinal: soft, non-tender, non-distended, no rebound/guarding Skin: warm, dry   Allergies as of 04/17/2018   Not on File     Medication List    TAKE these medications   benzonatate 200 MG capsule Commonly known as:  TESSALON Take 200 mg by mouth 3 (three) times daily as needed for cough.   brompheniramine-pseudoephedrine-DM 30-2-10 MG/5ML syrup Take 10 mLs by mouth 3 (three) times daily as needed for cough.   ciprofloxacin 500 MG tablet Commonly known as:  Cipro Take 1 tablet (500 mg total) by mouth 2 (two) times daily for 14 days.   metroNIDAZOLE 500 MG tablet Commonly known as:  Flagyl Take 1 tablet (500 mg total) by mouth 3 (three) times daily for 14 days.        Follow-up Information    Ancil Linsey, MD. Schedule an appointment as soon as possible for a visit in 1 week(s).   Specialty:  General Surgery Why:  diverticulitis with abscess Contact information: [address] [place] [address] [phone]            -- Lynden Oxford , PA-C  [facility]  04/17/2018, 11:49 AM [phone] M-F: 7am - 4pm

## 2018-04-18 LAB — HIV ANTIBODY (ROUTINE TESTING W REFLEX): HIV Screen 4th Generation wRfx: NONREACTIVE

## 2018-06-26 DIAGNOSIS — K5792 Diverticulitis of intestine, part unspecified, without perforation or abscess without bleeding: Secondary | ICD-10-CM

## 2018-06-26 HISTORY — DX: Diverticulitis of intestine, part unspecified, without perforation or abscess without bleeding: K57.92

## 2018-06-27 ENCOUNTER — Encounter (HOSPITAL_COMMUNITY): Payer: Self-pay | Admitting: Emergency Medicine

## 2018-06-27 ENCOUNTER — Inpatient Hospital Stay (HOSPITAL_COMMUNITY)
Admission: EM | Admit: 2018-06-27 | Discharge: 2018-06-30 | DRG: 392 | Disposition: A | Discharge status: Home / Self Care | Payer: BC Managed Care – PPO | Attending: Internal Medicine | Admitting: Internal Medicine

## 2018-06-27 ENCOUNTER — Other Ambulatory Visit: Payer: Self-pay

## 2018-06-27 DIAGNOSIS — R1032 Left lower quadrant pain: Secondary | ICD-10-CM | POA: Diagnosis not present

## 2018-06-27 DIAGNOSIS — N200 Calculus of kidney: Secondary | ICD-10-CM | POA: Diagnosis present

## 2018-06-27 DIAGNOSIS — K5720 Diverticulitis of large intestine with perforation and abscess without bleeding: Secondary | ICD-10-CM | POA: Diagnosis not present

## 2018-06-27 DIAGNOSIS — Z20828 Contact with and (suspected) exposure to other viral communicable diseases: Secondary | ICD-10-CM | POA: Diagnosis present

## 2018-06-27 DIAGNOSIS — K5792 Diverticulitis of intestine, part unspecified, without perforation or abscess without bleeding: Secondary | ICD-10-CM | POA: Diagnosis present

## 2018-06-27 DIAGNOSIS — K5900 Constipation, unspecified: Secondary | ICD-10-CM | POA: Diagnosis present

## 2018-06-27 LAB — CBC WITH DIFFERENTIAL/PLATELET
Abs Immature Granulocytes: 0.03 10*3/uL (ref 0.00–0.07)
Basophils Absolute: 0 10*3/uL (ref 0.0–0.1)
Basophils Relative: 0 %
Eosinophils Absolute: 0.1 10*3/uL (ref 0.0–0.5)
Eosinophils Relative: 1 %
HCT: 45.7 % (ref 39.0–52.0)
Hemoglobin: 15.5 g/dL (ref 13.0–17.0)
Immature Granulocytes: 0 %
Lymphocytes Relative: 20 %
Lymphs Abs: 2.2 10*3/uL (ref 0.7–4.0)
MCH: 29.1 pg (ref 26.0–34.0)
MCHC: 33.9 g/dL (ref 30.0–36.0)
MCV: 85.9 fL (ref 80.0–100.0)
Monocytes Absolute: 0.8 10*3/uL (ref 0.1–1.0)
Monocytes Relative: 8 %
Neutro Abs: 7.9 10*3/uL — ABNORMAL HIGH (ref 1.7–7.7)
Neutrophils Relative %: 71 %
Platelets: 324 10*3/uL (ref 150–400)
RBC: 5.32 MIL/uL (ref 4.22–5.81)
RDW: 11.9 % (ref 11.5–15.5)
WBC: 11 10*3/uL — ABNORMAL HIGH (ref 4.0–10.5)
nRBC: 0 % (ref 0.0–0.2)

## 2018-06-27 LAB — COMPREHENSIVE METABOLIC PANEL
ALT: 17 U/L (ref 0–44)
AST: 19 U/L (ref 15–41)
Albumin: 3.8 g/dL (ref 3.5–5.0)
Alkaline Phosphatase: 86 U/L (ref 38–126)
Anion gap: 9 (ref 5–15)
BUN: 16 mg/dL (ref 6–20)
CO2: 26 mmol/L (ref 22–32)
Calcium: 9.3 mg/dL (ref 8.9–10.3)
Chloride: 103 mmol/L (ref 98–111)
Creatinine, Ser: 0.94 mg/dL (ref 0.61–1.24)
GFR calc Af Amer: >60 mL/min (ref >60)
GFR calc non Af Amer: >60 mL/min (ref >60)
Glucose, Bld: 97 mg/dL (ref 70–99)
Potassium: 4.1 mmol/L (ref 3.5–5.1)
Sodium: 138 mmol/L (ref 135–145)
Total Bilirubin: 0.8 mg/dL (ref 0.3–1.2)
Total Protein: 7.8 g/dL (ref 6.5–8.1)

## 2018-06-27 NOTE — ED Triage Notes (Signed)
Daughter/translator stated, He has had a flare up of diverticulitis. He went to see his Dr. Today and he said for him to come here . His pain started on Saturday.Marland Kitchen

## 2018-06-28 ENCOUNTER — Emergency Department (HOSPITAL_COMMUNITY): Payer: BC Managed Care – PPO

## 2018-06-28 ENCOUNTER — Encounter (HOSPITAL_COMMUNITY): Payer: Self-pay | Admitting: Internal Medicine

## 2018-06-28 ENCOUNTER — Other Ambulatory Visit: Payer: Self-pay

## 2018-06-28 DIAGNOSIS — R1032 Left lower quadrant pain: Secondary | ICD-10-CM | POA: Diagnosis present

## 2018-06-28 DIAGNOSIS — K5792 Diverticulitis of intestine, part unspecified, without perforation or abscess without bleeding: Secondary | ICD-10-CM | POA: Diagnosis not present

## 2018-06-28 DIAGNOSIS — K5900 Constipation, unspecified: Secondary | ICD-10-CM | POA: Diagnosis present

## 2018-06-28 DIAGNOSIS — K5720 Diverticulitis of large intestine with perforation and abscess without bleeding: Principal | ICD-10-CM

## 2018-06-28 DIAGNOSIS — N200 Calculus of kidney: Secondary | ICD-10-CM | POA: Diagnosis present

## 2018-06-28 DIAGNOSIS — Z20828 Contact with and (suspected) exposure to other viral communicable diseases: Secondary | ICD-10-CM | POA: Diagnosis present

## 2018-06-28 LAB — URINALYSIS, ROUTINE W REFLEX MICROSCOPIC
Bilirubin Urine: NEGATIVE
Glucose, UA: NEGATIVE mg/dL
Hgb urine dipstick: NEGATIVE
Ketones, ur: 20 mg/dL — AB
Leukocytes,Ua: NEGATIVE
Nitrite: NEGATIVE
Protein, ur: NEGATIVE mg/dL
Specific Gravity, Urine: 1.013 (ref 1.005–1.030)
pH: 6 (ref 5.0–8.0)

## 2018-06-28 LAB — CBG MONITORING, ED: Glucose-Capillary: 85 mg/dL (ref 70–99)

## 2018-06-28 LAB — GLUCOSE, CAPILLARY: Glucose-Capillary: 97 mg/dL (ref 70–99)

## 2018-06-28 LAB — SARS CORONAVIRUS 2 BY RT PCR (HOSPITAL ORDER, PERFORMED IN ~~LOC~~ HOSPITAL LAB): SARS Coronavirus 2: NEGATIVE

## 2018-06-28 MED ORDER — ACETAMINOPHEN 325 MG PO TABS: 650.0000 mg | ORAL_TABLET | Freq: Four times a day (QID) | ORAL | Status: DC | PRN

## 2018-06-28 MED ORDER — SODIUM CHLORIDE 0.9 % IV SOLN
Freq: Once | INTRAVENOUS | Status: AC
  Administered 2018-06-28: 05:00:00 via INTRAVENOUS

## 2018-06-28 MED ORDER — MORPHINE SULFATE (PF) 4 MG/ML IV SOLN
4.0000 mg | Freq: Once | INTRAVENOUS | Status: AC
  Administered 2018-06-28: 4 mg via INTRAVENOUS
  Filled 2018-06-28: qty 1

## 2018-06-28 MED ORDER — OXYCODONE HCL 5 MG PO TABS: 5.0000 mg | ORAL_TABLET | ORAL | Status: DC | PRN

## 2018-06-28 MED ORDER — MORPHINE SULFATE (PF) 2 MG/ML IV SOLN: 1.0000 mg | INTRAVENOUS | Status: DC | PRN

## 2018-06-28 MED ORDER — PIPERACILLIN-TAZOBACTAM 3.375 G IVPB
3.3750 g | Freq: Three times a day (TID) | INTRAVENOUS | Status: DC
  Administered 2018-06-28 – 2018-06-30 (×7): 3.375 g via INTRAVENOUS
  Filled 2018-06-28 (×6): qty 50

## 2018-06-28 MED ORDER — ACETAMINOPHEN 650 MG RE SUPP: 650.0000 mg | Freq: Four times a day (QID) | RECTAL | Status: DC | PRN

## 2018-06-28 MED ORDER — ENOXAPARIN SODIUM 40 MG/0.4ML ~~LOC~~ SOLN
40.0000 mg | SUBCUTANEOUS | Status: DC
  Administered 2018-06-28 – 2018-06-29 (×2): 40 mg via SUBCUTANEOUS
  Filled 2018-06-28 (×2): qty 0.4

## 2018-06-28 MED ORDER — DEXTROSE-NACL 5-0.9 % IV SOLN
INTRAVENOUS | Status: AC
  Administered 2018-06-28 – 2018-06-29 (×3): via INTRAVENOUS

## 2018-06-28 MED ORDER — CIPROFLOXACIN IN D5W 400 MG/200ML IV SOLN
400.0000 mg | Freq: Once | INTRAVENOUS | Status: AC
  Administered 2018-06-28: 400 mg via INTRAVENOUS
  Filled 2018-06-28: qty 200

## 2018-06-28 MED ORDER — ONDANSETRON HCL 4 MG/2ML IJ SOLN: 4.0000 mg | Freq: Four times a day (QID) | INTRAMUSCULAR | Status: DC | PRN

## 2018-06-28 MED ORDER — ONDANSETRON HCL 4 MG PO TABS: 4.0000 mg | ORAL_TABLET | Freq: Four times a day (QID) | ORAL | Status: DC | PRN

## 2018-06-28 MED ORDER — METRONIDAZOLE IN NACL 5-0.79 MG/ML-% IV SOLN
500.0000 mg | Freq: Once | INTRAVENOUS | Status: AC
  Administered 2018-06-28: 500 mg via INTRAVENOUS
  Filled 2018-06-28: qty 100

## 2018-06-28 MED ORDER — SODIUM CHLORIDE 0.9 % IV BOLUS
1000.0000 mL | Freq: Once | INTRAVENOUS | Status: AC
  Administered 2018-06-28: 1000 mL via INTRAVENOUS

## 2018-06-28 NOTE — Care Management (Signed)
Spoke w patient at bedside with [facility] interpreter. Patient confirmed that he has insurance, but does not have a PCP. He lives in [place] and we discussed clinics in [place]. He would like to go to [facility] as he was a patient there previously. Will ask Megan CMA to schedule appointment and place on AVS. Patient also asked about how he would pay his bill. He is insured but is concerned about his copays. We discussed him calling the number on his when when he gets it in the mail and setting up a payment plan if needed.

## 2018-06-28 NOTE — Progress Notes (Signed)
Pharmacy Antibiotic Note  Jeffrey Charles is a 48 y.o. male admitted on 06/27/2018 with intra abdominal infection.  Pharmacy has been consulted for zosyn dosing.  Plan: Zosyn 3.375g IV q8h (4 hour infusion).  F/u cultures and clinical course  Height: 5' (152.4 cm) Weight: 147 lb (66.7 kg) IBW/kg (Calculated) : 50  Temp (24hrs), Avg:98.6 F (37 C), Min:98.6 F (37 C), Max:98.6 F (37 C)  Recent Labs  Lab 06/27/18 1833  WBC 11.0*  CREATININE 0.94    Estimated Creatinine Clearance: 76.2 mL/min (by C-G formula based on SCr of 0.94 mg/dL).    No Known Allergies   Thank you for allowing pharmacy to be a part of this patient's care.  Talbert Cage Poteet 06/28/2018 7:15 AM

## 2018-06-28 NOTE — ED Provider Notes (Signed)
[facility] EMERGENCY DEPARTMENT Provider Note  CSN: [id number] Arrival date & time: 06/27/18 1747  Chief Complaint(s) Diverticulitis  HPI Jeffrey Charles is a 48 y.o. male with a history of recurrent diverticulitis who was admitted 2 months ago to [facility] for diverticulitis with abscess that was treated with IV antibiotics.  At that time surgeon discussed possibility of resection if recurs.  He presents today for 2 days of lower abdominal pain similar to his prior diverticulitis.  Pain is been indolent in nature and gradually worsening since onset.  Exacerbated with palpation and bowel movements.  He does endorse mild constipation which is a change in his bowel habit.  No real alleviating factors.  He denies any fevers or chills.  No nausea or vomiting.  No urinary symptoms.   HPI  Past Medical History History reviewed. No pertinent past medical history. Patient Active Problem List   Diagnosis Date Noted   Diverticulitis of large intestine with abscess without bleeding 04/15/2018   Home Medication(s) Prior to Admission medications   Not on File                                                                                                                                    Past Surgical History History reviewed. No pertinent surgical history. Family History No family history on file.  Social History Social History   Tobacco Use   Smoking status: Never Smoker   Smokeless tobacco: Never Used  Substance Use Topics   Alcohol use: Yes    Alcohol/week: 4.0 standard drinks    Types: 4 Cans of beer per week    Comment: Weekly    Drug use: Not on file   Allergies Patient has no known allergies.  Review of Systems Review of Systems All other systems are reviewed and are negative for acute change except as noted in the HPI  Physical Exam Vital Signs  I have reviewed the triage vital signs BP 118/84    Pulse 68    Temp 98.6 F (37 C)  (Oral)    Resp 16    Ht 5' (1.524 m)    Wt 66.7 kg    SpO2 96%    BMI 28.71 kg/m   Physical Exam Vitals signs reviewed.  Constitutional:      General: He is not in acute distress.    Appearance: He is well-developed. He is not diaphoretic.  HENT:     Head: Normocephalic and atraumatic.     Jaw: No trismus.     Right Ear: External ear normal.     Left Ear: External ear normal.     Nose: Nose normal.  Eyes:     General: No scleral icterus.    Conjunctiva/sclera: Conjunctivae normal.  Neck:     Musculoskeletal: Normal range of motion.     Trachea: Phonation normal.  Cardiovascular:     Rate and Rhythm: Normal  rate and regular rhythm.  Pulmonary:     Effort: Pulmonary effort is normal. No respiratory distress.     Breath sounds: No stridor.  Abdominal:     General: There is no distension.     Tenderness: There is abdominal tenderness in the suprapubic area and left lower quadrant. There is no guarding or rebound.  Musculoskeletal: Normal range of motion.  Neurological:     Mental Status: He is alert and oriented to person, place, and time.  Psychiatric:        Behavior: Behavior normal.     ED Results and Treatments Labs (all labs ordered are listed, but only abnormal results are displayed) Labs Reviewed  CBC WITH DIFFERENTIAL/PLATELET - Abnormal; Notable for the following components:      Result Value   WBC 11.0 (*)    Neutro Abs 7.9 (*)    All other components within normal limits  URINALYSIS, ROUTINE W REFLEX MICROSCOPIC - Abnormal; Notable for the following components:   Ketones, ur 20 (*)    All other components within normal limits  SARS CORONAVIRUS 2 (HOSPITAL ORDER, PERFORMED IN [facility])  COMPREHENSIVE METABOLIC PANEL                                                                                                                         EKG  EKG Interpretation  Date/Time:    Ventricular Rate:    PR Interval:    QRS Duration:   QT  Interval:    QTC Calculation:   R Axis:     Text Interpretation:        Radiology Ct Abdomen Pelvis Wo Contrast  Result Date: 06/28/2018 CLINICAL DATA:  48 year old male with abdominal pain. Concern for acute diverticulitis. EXAM: CT ABDOMEN AND PELVIS WITHOUT CONTRAST TECHNIQUE: Multidetector CT imaging of the abdomen and pelvis was performed following the standard protocol without IV contrast. COMPARISON:  CT of the abdomen pelvis dated 04/15/2018 FINDINGS: Evaluation of this exam is limited in the absence of intravenous contrast. Lower chest: There is a 5 mm right lung base subpleural calcified granuloma. The visualized lung bases are otherwise clear. No intra-abdominal free air or free fluid. Hepatobiliary: No focal liver abnormality is seen. No gallstones, gallbladder wall thickening, or biliary dilatation. Pancreas: Unremarkable. No pancreatic ductal dilatation or surrounding inflammatory changes. Spleen: Normal in size without focal abnormality. Adrenals/Urinary Tract: The adrenal glands are unremarkable. There is a punctate nonobstructing right renal interpolar calculus. No hydronephrosis. The left kidney is unremarkable. The visualized ureters and urinary bladder appear unremarkable. Stomach/Bowel: There is extensive sigmoid diverticulosis with muscular hypertrophy. There is active inflammatory changes of the sigmoid colon consistent with acute diverticulitis. There is loss of fat plane between the sigmoid colon and dome of the bladder with thickened appearance of the bladder dome consistent with adhesions. An early colovesical fistula is not entirely excluded. However, no gas is identified within the bladder at this time. There is a 1.4 x 1.2  cm complex collection along the inferior aspect of the sigmoid colon in the superior portion of the colovesical adhesion (coronal series 7, image 42). This may represent a diverticular abscess or extension of colonic air and fluid into the partially  cannulized colovesical fistula. There is no bowel obstruction. The appendix is normal. Vascular/Lymphatic: The abdominal aorta and IVC are grossly unremarkable on this noncontrast CT. No portal venous gas. There is no adenopathy. Reproductive: The prostate and seminal vesicles are grossly unremarkable. No pelvic mass. Other: Small fat containing umbilical hernia. Musculoskeletal: No acute or significant osseous findings. IMPRESSION: 1. Sigmoid diverticulitis with findings of colovesical adhesion or possible early fistulization. A complex collection along the inferior aspect of the sigmoid colon in the superior portion of the colovesical adhesion may represent a diverticular abscess or extension of colonic air and fluid into the partially cannulizedal fistula. 2. Punctate nonobstructing right renal interpolar stone. No hydronephrosis. Electronically Signed   By: Elgie Collard M.D.   On: 06/28/2018 02:46   Pertinent labs & imaging results that were available during my care of the patient were reviewed by me and considered in my medical decision making (see chart for details).  Medications Ordered in ED Medications  ciprofloxacin (CIPRO) IVPB 400 mg (400 mg Intravenous New Bag/Given 06/28/18 0458)    And  metroNIDAZOLE (FLAGYL) IVPB 500 mg (0 mg Intravenous Stopped 06/28/18 0454)  sodium chloride 0.9 % bolus 1,000 mL (0 mLs Intravenous Stopped 06/28/18 0353)  morphine 4 MG/ML injection 4 mg (4 mg Intravenous Given 06/28/18 0314)  0.9 %  sodium chloride infusion ( Intravenous New Bag/Given 06/28/18 0451)                                                                                                                                    Procedures Procedures  (including critical care time)  Medical Decision Making / ED Course I have reviewed the nursing notes for this encounter and the patient's prior records (if available in EHR or on provided paperwork).    Work-up notable for acute diverticulitis with  possible anastomotic adhesion, early colovesicular fistula or small abscess.  I discussed the case with Dr. Earlene Plater from [facility] who recommended IV antibiotics.  Patient will need outpatient follow-up once treated to discuss surgical management.  Patient chose to stay at [facility] for IV antibiotics.  Patient provided with pain medicine and IV antibiotics.  Patient does not appear to be septic at this time.  Will admit to medicine.  Final Clinical Impression(s) / ED Diagnoses Final diagnoses:  Diverticulitis of large intestine with abscess without bleeding      This chart was dictated using voice recognition software.  Despite best efforts to proofread,  errors can occur which can change the documentation meaning.   Nira Conn, MD 06/28/18 [phone]

## 2018-06-28 NOTE — Progress Notes (Signed)
PROGRESS NOTE    Jeffrey Charles  YNW:[id number] DOB: [birth year] DOA: 06/27/2018 PCP: Patient, No Pcp Per (Confirm with patient/family/NH records and if not entered, this HAS to be entered at [facility] point of entry. "No PCP" if truly none.)   Brief Narrative: Jeffrey Charles is a 48 y.o. male with no significant past medical history admitted at [facility] in March 2020 3 months ago for diverticulitis with abscess at that time patient was treated conservatively with oral antibiotics presents to the ER with complaints of abdominal pain.  Patient has been having left lower quadrant abdominal pain for the last 4 days.  Denies any nausea vomiting diarrhea or any discolored urine.  Denies fever chills.  Pain is constant not related to food. In the ER patient was afebrile and labs show mild leukocytosis.  CT of the abdomen and pelvis done shows left sided diverticulitis with possible abscess and fistulization.  ER physician had discussed with surgeon at [facility] but patient wants to be admitted at [facility].  Assessment & Plan:   Principal Problem:   Diverticulitis Active Problems:   Diverticulitis of large intestine with abscess without bleeding  Sigmoid diverticulitis with possible early fistulization and possible abscess, POA, ongoing  -Surgery following, appreciate insight/recommendations  -Imaging remarkable for possible early fistula versus abscess, too small for intervention  -Continue IV fluids, n.p.o. except meds and sips  -Continue Zosyn(started 06/28/2018), previously resolved with outpatient with Cipro/Flagyl earlier this year  -Patient does not meet sepsis criteria at this time  -Currently holding off on procedure per surgery, no emergent or urgent findings  -Pending patient's clinical course with ongoing possible need for surgical intervention if patient clinically worsens -to meet disposition likely home, again this is pending  patient's clinical course suspect at least a 3 to 4-day hospital stay for IV antibiotics close monitoring given patient's high risk for decompensation.  DVT prophylaxis: Lovenox Code Status: Full Disposition Plan: Pending clinical status, suspect additional 72 to 96 hours of hospital day for IV antibiotics, bowel rest and further surgical evaluation.  Pending clinical improvement suspect reinitiation of diet and p.o. antibiotics in 48 to 72 hours, tolerated well would suspect discharge home thereafter.  If clinically worsening would consider further discussion with surgery for possible intervention which would likely prolong patient stay given his multiple diverticulitis flares over the past few months  Consultants:   General surgery  Procedures: None planned currently   Antimicrobials: Zosyn, start 06/28/2018    Subjective: No acute issues or events overnight, abdominal pain moderately improved with current regimen.  Denies fevers, chills, headache, chest pain, shortness of breath.  Objective: Vitals:   06/28/18 0500 06/28/18 0600 06/28/18 0700 06/28/18 1200  BP: 109/71 91/65 94/70  115/72  Pulse: 60 (!) 57 (!) 55 (!) 55  Resp: Temp:      TempSrc:      SpO2: 97% 97% 98% 99%  Weight:      Height:        Intake/Output Summary (Last 24 hours) at 06/28/2018 1218 Last data filed at 06/28/2018 0353 Gross per 24 hour  Intake 1000 ml  Output -  Net 1000 ml   Filed Weights   06/28/18 0023  Weight: 66.7 kg    Examination:  General exam: Appears calm and comfortable  Respiratory system: Clear to auscultation. Respiratory effort normal. Cardiovascular system: S1 & S2 heard, RRR. No JVD, murmurs, rubs, gallops or clicks. No pedal edema. Gastrointestinal  system: Abdomen is nondistended, soft and nontender. No organomegaly or masses felt. Normal bowel sounds heard. Central nervous system: Alert and oriented. No focal neurological deficits. Extremities: Symmetric 5 x 5  power. Skin: No rashes, lesions or ulcers Psychiatry: Judgement and insight appear normal. Mood & affect appropriate.     Data Reviewed: I have personally reviewed following labs and imaging studies  CBC: Recent Labs  Lab 06/27/18 1833  WBC 11.0*  NEUTROABS 7.9*  HGB 15.5  HCT 45.7  MCV 85.9  PLT 324   Basic Metabolic Panel: Recent Labs  Lab 06/27/18 1833  NA 138  K 4.1  CL 103  CO2 26  GLUCOSE 97  BUN 16  CREATININE 0.94  CALCIUM 9.3   GFR: Estimated Creatinine Clearance: 76.2 mL/min (by C-G formula based on SCr of 0.94 mg/dL). Liver Function Tests: Recent Labs  Lab 06/27/18 1833  AST 19  ALT 17  ALKPHOS 86  BILITOT 0.8  PROT 7.8  ALBUMIN 3.8   No results for input(s): LIPASE, AMYLASE in the last 168 hours. No results for input(s): AMMONIA in the last 168 hours. Coagulation Profile: No results for input(s): INR, PROTIME in the last 168 hours. Cardiac Enzymes: No results for input(s): CKTOTAL, CKMB, CKMBINDEX, TROPONINI in the last 168 hours. BNP (last 3 results) No results for input(s): PROBNP in the last 8760 hours. HbA1C: No results for input(s): HGBA1C in the last 72 hours. CBG: Recent Labs  Lab 06/28/18 0832  GLUCAP 85   Lipid Profile: No results for input(s): CHOL, HDL, LDLCALC, TRIG, CHOLHDL, LDLDIRECT in the last 72 hours. Thyroid Function Tests: No results for input(s): TSH, T4TOTAL, FREET4, T3FREE, THYROIDAB in the last 72 hours. Anemia Panel: No results for input(s): VITAMINB12, FOLATE, FERRITIN, TIBC, IRON, RETICCTPCT in the last 72 hours. Sepsis Labs: No results for input(s): PROCALCITON, LATICACIDVEN in the last 168 hours.  Recent Results (from the past 240 hour(s))  SARS Coronavirus 2 (CEPHEID - Performed in [facility] hospital lab), Hosp Order     Status: None   Collection Time: 06/28/18  4:35 AM  Result Value Ref Range Status   SARS Coronavirus 2 NEGATIVE NEGATIVE Final    Comment: (NOTE) If result is NEGATIVE SARS-CoV-2  target nucleic acids are NOT DETECTED. The SARS-CoV-2 RNA is generally detectable in upper and lower  respiratory specimens during the acute phase of infection. The lowest  concentration of SARS-CoV-2 viral copies this assay can detect is 250  copies / mL. A negative result does not preclude SARS-CoV-2 infection  and should not be used as the sole basis for treatment or other  patient management decisions.  A negative result may occur with  improper specimen collection / handling, submission of specimen other  than nasopharyngeal swab, presence of viral mutation(s) within the  areas targeted by this assay, and inadequate number of viral copies  (<250 copies / mL). A negative result must be combined with clinical  observations, patient history, and epidemiological information. If result is POSITIVE SARS-CoV-2 target nucleic acids are DETECTED. The SARS-CoV-2 RNA is generally detectable in upper and lower  respiratory specimens dur ing the acute phase of infection.  Positive  results are indicative of active infection with SARS-CoV-2.  Clinical  correlation with patient history and other diagnostic information is  necessary to determine patient infection status.  Positive results do  not rule out bacterial infection or co-infection with other viruses. If result is PRESUMPTIVE POSTIVE SARS-CoV-2 nucleic acids MAY BE PRESENT.   A presumptive  positive result was obtained on the submitted specimen  and confirmed on repeat testing.  While 2019 novel coronavirus  (SARS-CoV-2) nucleic acids may be present in the submitted sample  additional confirmatory testing may be necessary for epidemiological  and / or clinical management purposes  to differentiate between  SARS-CoV-2 and other Sarbecovirus currently known to infect humans.  If clinically indicated additional testing with an alternate test  methodology [phone]) is advised. The SARS-CoV-2 RNA is generally  detectable in upper and lower  respiratory sp ecimens during the acute  phase of infection. The expected result is Negative. Fact Sheet for Patients:  BoilerBrush.com.cy Fact Sheet for Healthcare Providers: https://pope.com/ This test is not yet approved or cleared by the [place] FDA and has been authorized for detection and/or diagnosis of SARS-CoV-2 by FDA under an Emergency Use Authorization (EUA).  This EUA will remain in effect (meaning this test can be used) for the duration of the COVID-19 declaration under Section 564(b)(1) of the Act, 21 U.S.C. section 360bbb-3(b)(1), unless the authorization is terminated or revoked sooner. Performed at [facility], [address], [place], [place] [address]          Radiology Studies: Ct Abdomen Pelvis Wo Contrast  Result Date: 06/28/2018 CLINICAL DATA:  48 year old male with abdominal pain. Concern for acute diverticulitis. EXAM: CT ABDOMEN AND PELVIS WITHOUT CONTRAST TECHNIQUE: Multidetector CT imaging of the abdomen and pelvis was performed following the standard protocol without IV contrast. COMPARISON:  CT of the abdomen pelvis dated 04/15/2018 FINDINGS: Evaluation of this exam is limited in the absence of intravenous contrast. Lower chest: There is a 5 mm right lung base subpleural calcified granuloma. The visualized lung bases are otherwise clear. No intra-abdominal free air or free fluid. Hepatobiliary: No focal liver abnormality is seen. No gallstones, gallbladder wall thickening, or biliary dilatation. Pancreas: Unremarkable. No pancreatic ductal dilatation or surrounding inflammatory changes. Spleen: Normal in size without focal abnormality. Adrenals/Urinary Tract: The adrenal glands are unremarkable. There is a punctate nonobstructing right renal interpolar calculus. No hydronephrosis. The left kidney is unremarkable. The visualized ureters and urinary bladder appear unremarkable. Stomach/Bowel: There is  extensive sigmoid diverticulosis with muscular hypertrophy. There is active inflammatory changes of the sigmoid colon consistent with acute diverticulitis. There is loss of fat plane between the sigmoid colon and dome of the bladder with thickened appearance of the bladder dome consistent with adhesions. An early colovesical fistula is not entirely excluded. However, no gas is identified within the bladder at this time. There is a 1.4 x 1.2 cm complex collection along the inferior aspect of the sigmoid colon in the superior portion of the colovesical adhesion (coronal series 7, image 42). This may represent a diverticular abscess or extension of colonic air and fluid into the partially cannulized colovesical fistula. There is no bowel obstruction. The appendix is normal. Vascular/Lymphatic: The abdominal aorta and IVC are grossly unremarkable on this noncontrast CT. No portal venous gas. There is no adenopathy. Reproductive: The prostate and seminal vesicles are grossly unremarkable. No pelvic mass. Other: Small fat containing umbilical hernia. Musculoskeletal: No acute or significant osseous findings. IMPRESSION: 1. Sigmoid diverticulitis with findings of colovesical adhesion or possible early fistulization. A complex collection along the inferior aspect of the sigmoid colon in the superior portion of the colovesical adhesion may represent a diverticular abscess or extension of colonic air and fluid into the partially cannulizedal fistula. 2. Punctate nonobstructing right renal interpolar stone. No hydronephrosis. Electronically Signed   By: Burtis Junes  Radparvar M.D.   On: 06/28/2018 02:46        Scheduled Meds: Continuous Infusions: . dextrose 5 % and 0.9% NaCl 100 mL/hr at 06/28/18 0830  . piperacillin-tazobactam (ZOSYN)  IV 3.375 g (06/28/18 0831)     LOS: 0 days    Time spent:    Azucena Fallen, DO [facility] Pager [phone]  If 7PM-7AM, please contact night-coverage  [email or web address] Password [email or web address] 06/28/2018, 12:19 PM

## 2018-06-28 NOTE — ED Notes (Signed)
RN gave report to Christel Mormon, Charity fundraiser.

## 2018-06-28 NOTE — H&P (Signed)
History and Physical    Jeffrey Charles ZOX:[id number] DOB: 12-05-1970 DOA: 06/27/2018  PCP: Patient, No Pcp Per  Patient coming from: Home.  Chief Complaint: Abdominal pain.  HPI: Jeffrey Charles is a 48 y.o. male with no significant past medical history admitted at [facility] in March 2020 3 months ago for diverticulitis with abscess at that time patient was treated conservatively with oral antibiotics presents to the ER with complaints of abdominal pain.  Patient has been having left lower quadrant abdominal pain for the last 4 days.  Denies any nausea vomiting diarrhea or any discolored urine.  Denies fever chills.  Pain is constant not related to food.  ED Course: In the ER patient was afebrile and labs show mild leukocytosis.  CT of the abdomen and pelvis done shows left sided diverticulitis with possible abscess and fistulization.  ER physician had discussed with surgeon at [facility] but patient wants to be admitted at [facility].  Review of Systems: As per HPI, rest all negative.   History reviewed. No pertinent past medical history.  History reviewed. No pertinent surgical history.   reports that he has never smoked. He has never used smokeless tobacco. He reports current alcohol use of about 4.0 standard drinks of alcohol per week. No history on file for drug.  No Known Allergies  Family History  Family history unknown: Yes    Prior to Admission medications   Not on File    Physical Exam: Vitals:   06/28/18 0447 06/28/18 0500 06/28/18 0600 06/28/18 0700  BP: 112/77 109/71 91/65 94/70   Pulse: 64 60 (!) 57 (!) 55  Resp: 17 16 16 16   Temp:      TempSrc:      SpO2: 100% 97% 97% 98%  Weight:      Height:          Constitutional: Moderately built and nourished. Vitals:   06/28/18 0447 06/28/18 0500 06/28/18 0600 06/28/18 0700  BP: 112/77 109/71 91/65 94/70   Pulse: 64 60 (!) 57 (!) 55  Resp: 17 16  16 16   Temp:      TempSrc:      SpO2: 100% 97% 97% 98%  Weight:      Height:       Eyes: Anicteric no pallor. ENMT: No discharge from the ears eyes nose and mouth. Neck: No mass felt.  No neck rigidity. Respiratory: No rhonchi or crepitations. Cardiovascular: S1-S2 heard. Abdomen: Soft left lower quadrant tenderness no guarding or rigidity. Musculoskeletal: No edema. Skin: No rash. Neurologic: Alert awake oriented to time place and person.  Moves all extremities. Psychiatric: Appears normal.  Normal affect.   Labs on Admission: I have personally reviewed following labs and imaging studies  CBC: Recent Labs  Lab 06/27/18 1833  WBC 11.0*  NEUTROABS 7.9*  HGB 15.5  HCT 45.7  MCV 85.9  PLT 324   Basic Metabolic Panel: Recent Labs  Lab 06/27/18 1833  NA 138  K 4.1  CL 103  CO2 26  GLUCOSE 97  BUN 16  CREATININE 0.94  CALCIUM 9.3   GFR: Estimated Creatinine Clearance: 76.2 mL/min (by C-G formula based on SCr of 0.94 mg/dL). Liver Function Tests: Recent Labs  Lab 06/27/18 1833  AST 19  ALT 17  ALKPHOS 86  BILITOT 0.8  PROT 7.8  ALBUMIN 3.8   No results for input(s): LIPASE, AMYLASE in the last 168 hours. No results for input(s): AMMONIA in the last  168 hours. Coagulation Profile: No results for input(s): INR, PROTIME in the last 168 hours. Cardiac Enzymes: No results for input(s): CKTOTAL, CKMB, CKMBINDEX, TROPONINI in the last 168 hours. BNP (last 3 results) No results for input(s): PROBNP in the last 8760 hours. HbA1C: No results for input(s): HGBA1C in the last 72 hours. CBG: No results for input(s): GLUCAP in the last 168 hours. Lipid Profile: No results for input(s): CHOL, HDL, LDLCALC, TRIG, CHOLHDL, LDLDIRECT in the last 72 hours. Thyroid Function Tests: No results for input(s): TSH, T4TOTAL, FREET4, T3FREE, THYROIDAB in the last 72 hours. Anemia Panel: No results for input(s): VITAMINB12, FOLATE, FERRITIN, TIBC, IRON, RETICCTPCT in the last  72 hours. Urine analysis:    Component Value Date/Time   COLORURINE YELLOW 06/28/2018 0245   APPEARANCEUR CLEAR 06/28/2018 0245   LABSPEC 1.013 06/28/2018 0245   PHURINE 6.0 06/28/2018 0245   GLUCOSEU NEGATIVE 06/28/2018 0245   HGBUR NEGATIVE 06/28/2018 0245   BILIRUBINUR NEGATIVE 06/28/2018 0245   KETONESUR 20 (A) 06/28/2018 0245   PROTEINUR NEGATIVE 06/28/2018 0245   NITRITE NEGATIVE 06/28/2018 0245   LEUKOCYTESUR NEGATIVE 06/28/2018 0245   Sepsis Labs: @LABRCNTIP (procalcitonin:4,lacticidven:4) ) Recent Results (from the past 240 hour(s))  SARS Coronavirus 2 (CEPHEID - Performed in [facility] hospital lab), Hosp Order     Status: None   Collection Time: 06/28/18  4:35 AM  Result Value Ref Range Status   SARS Coronavirus 2 NEGATIVE NEGATIVE Final    Comment: (NOTE) If result is NEGATIVE SARS-CoV-2 target nucleic acids are NOT DETECTED. The SARS-CoV-2 RNA is generally detectable in upper and lower  respiratory specimens during the acute phase of infection. The lowest  concentration of SARS-CoV-2 viral copies this assay can detect is 250  copies / mL. A negative result does not preclude SARS-CoV-2 infection  and should not be used as the sole basis for treatment or other  patient management decisions.  A negative result may occur with  improper specimen collection / handling, submission of specimen other  than nasopharyngeal swab, presence of viral mutation(s) within the  areas targeted by this assay, and inadequate number of viral copies  (<250 copies / mL). A negative result must be combined with clinical  observations, patient history, and epidemiological information. If result is POSITIVE SARS-CoV-2 target nucleic acids are DETECTED. The SARS-CoV-2 RNA is generally detectable in upper and lower  respiratory specimens dur ing the acute phase of infection.  Positive  results are indicative of active infection with SARS-CoV-2.  Clinical  correlation with patient history  and other diagnostic information is  necessary to determine patient infection status.  Positive results do  not rule out bacterial infection or co-infection with other viruses. If result is PRESUMPTIVE POSTIVE SARS-CoV-2 nucleic acids MAY BE PRESENT.   A presumptive positive result was obtained on the submitted specimen  and confirmed on repeat testing.  While 2019 novel coronavirus  (SARS-CoV-2) nucleic acids may be present in the submitted sample  additional confirmatory testing may be necessary for epidemiological  and / or clinical management purposes  to differentiate between  SARS-CoV-2 and other Sarbecovirus currently known to infect humans.  If clinically indicated additional testing with an alternate test  methodology [phone]) is advised. The SARS-CoV-2 RNA is generally  detectable in upper and lower respiratory sp ecimens during the acute  phase of infection. The expected result is Negative. Fact Sheet for Patients:  BoilerBrush.com.cy Fact Sheet for Healthcare Providers: https://pope.com/ This test is not yet approved or cleared by the  [place] [employer] trader and has been authorized for detection and/or diagnosis of SARS-CoV-2 by FDA under an TEFL teacher (EUA).  This EUA will remain in effect (meaning this test can be used) for the duration of the COVID-19 declaration under Section 564(b)(1) of the Act, 21 U.S.C. section 360bbb-3(b)(1), unless the authorization is terminated or revoked sooner. Performed at [facility], [address], [place], [place] [address]      Radiological Exams on Admission: Ct Abdomen Pelvis Wo Contrast  Result Date: 06/28/2018 CLINICAL DATA:  48 year old male with abdominal pain. Concern for acute diverticulitis. EXAM: CT ABDOMEN AND PELVIS WITHOUT CONTRAST TECHNIQUE: Multidetector CT imaging of the abdomen and pelvis was performed following the standard protocol without IV  contrast. COMPARISON:  CT of the abdomen pelvis dated 04/15/2018 FINDINGS: Evaluation of this exam is limited in the absence of intravenous contrast. Lower chest: There is a 5 mm right lung base subpleural calcified granuloma. The visualized lung bases are otherwise clear. No intra-abdominal free air or free fluid. Hepatobiliary: No focal liver abnormality is seen. No gallstones, gallbladder wall thickening, or biliary dilatation. Pancreas: Unremarkable. No pancreatic ductal dilatation or surrounding inflammatory changes. Spleen: Normal in size without focal abnormality. Adrenals/Urinary Tract: The adrenal glands are unremarkable. There is a punctate nonobstructing right renal interpolar calculus. No hydronephrosis. The left kidney is unremarkable. The visualized ureters and urinary bladder appear unremarkable. Stomach/Bowel: There is extensive sigmoid diverticulosis with muscular hypertrophy. There is active inflammatory changes of the sigmoid colon consistent with acute diverticulitis. There is loss of fat plane between the sigmoid colon and dome of the bladder with thickened appearance of the bladder dome consistent with adhesions. An early colovesical fistula is not entirely excluded. However, no gas is identified within the bladder at this time. There is a 1.4 x 1.2 cm complex collection along the inferior aspect of the sigmoid colon in the superior portion of the colovesical adhesion (coronal series 7, image 42). This may represent a diverticular abscess or extension of colonic air and fluid into the partially cannulized colovesical fistula. There is no bowel obstruction. The appendix is normal. Vascular/Lymphatic: The abdominal aorta and IVC are grossly unremarkable on this noncontrast CT. No portal venous gas. There is no adenopathy. Reproductive: The prostate and seminal vesicles are grossly unremarkable. No pelvic mass. Other: Small fat containing umbilical hernia. Musculoskeletal: No acute or significant  osseous findings. IMPRESSION: 1. Sigmoid diverticulitis with findings of colovesical adhesion or possible early fistulization. A complex collection along the inferior aspect of the sigmoid colon in the superior portion of the colovesical adhesion may represent a diverticular abscess or extension of colonic air and fluid into the partially cannulizedal fistula. 2. Punctate nonobstructing right renal interpolar stone. No hydronephrosis. Electronically Signed   By: Elgie Collard M.D.   On: 06/28/2018 02:46     Assessment/Plan Principal Problem:   Diverticulitis Active Problems:   Diverticulitis of large intestine with abscess without bleeding    1. Acute diverticulitis with possible abscess and fistulization -I have discussed with on-call general surgery will be seeing patient in consult.  We will keep patient n.p.o. antibiotics IV fluids pain relief medications. 2. Nonobstructing right renal stone. 3. Leukocytosis likely from #1.   DVT prophylaxis: SCDs. Code Status: Full code. Family Communication: Discussed with patient. Disposition Plan: Home. Consults called: General surgery. Admission status: Inpatient.   Eduard Clos MD [facility] Pager [phone].  If 7PM-7AM, please contact night-coverage [email or web address] Password [email or web address]  06/28/2018, 7:04 AM

## 2018-06-28 NOTE — Consult Note (Signed)
[facility] Surgery Consult Note  Jeffrey Charles [birth year]  [id number].    Requesting MD: Dr. Toniann Fail Chief Complaint/Reason for Consult: Diverticulitis   HPI: Jeffrey Charles is 48 y.o. male with a history of diverticulitis who was admitted to the hospitalist service on 6/3 for diverticulitis.   The patient reports that on Saturday, 5/30 he began having gradual onset of suprapubic abdominal pain.  He describes his pain as sharp in characteristic.  He reports that the pain has gradually increased throughout the days. Eventually the pain became severe on 6/2, when he presented to the ED w/ a pain level of 7 or 8/10 and at its worse. He reports associated dysuria with his symptoms that began on 5/31.  He also reports several loose non-bloody, non-melanotic BMs several times per day (last Bm, last night).  He reports that 1 day he did have some radiation of the pain to his right lower quadrant and left lower quadrant that was also sharp in nature but this has resolved.  He feels this is similar to his prior episodes of diverticulitis.  He reports this is the third time he is ever had the symptoms.  He reports his first episode of diverticulitis was in 2014 at [facility].  This is confirmed on chart review by CT scan done on 03/30/2012 with prominent changes of sigmoid colonic diverticulitis.  Patient also had a recent episode of diverticulitis on 04/15/2018 where he was admitted with sigmoid diverticulitis with a 9 x 15 x 8 mm abscess.  Patient was treated with IV antibiotics in the hospital x 2 days and discharged on 14-day course of Cipro/Flagyl.  He reports he did not follow-up with the surgeon afterwards.  He notes that his pain did fully resolve after the course of antibiotics.  The patient denies history of a colonoscopy in the past.  He denies family history of colon cancer or IBD.  The patient denies associated fever, chills, nausea, vomiting, diarrhea, melena, hematochezia,  pneumaturia, or foul odor of his urine.  He denies prior abdominal surgery.  He does not take any daily medications.  He is not on blood thinners. After pain medication in the ED his pain has improved and it is currently a 4/10.   In the ED patient was afebrile without tachycardia, tachypnea or hypoxia.  He did have 2 episodes of low blood pressure at 91/65 and 94/70 but no correlating tachycardia with this.  The patient was found to have an elevated WBC of 11,000.  His CT scan showed sigmoid diverticulitis with findings of a colovesicular adhesion or possible early fistulization.  There is also a complex collection along the inferior aspect of the sigmoid colon could represent an abscess or colonic air/fluid into the partially cannulized fistula.   Stratus interpreter and chart review was used to collect the above information.  ROS: Review of Systems  Constitutional: Negative for chills and fever.  Respiratory: Negative for cough and shortness of breath.   Cardiovascular: Negative for chest pain and leg swelling.  Gastrointestinal: Positive for abdominal pain. Negative for blood in stool, constipation, diarrhea, melena, nausea and vomiting.       Loose stools  Genitourinary: Positive for dysuria. Negative for flank pain, frequency, hematuria and urgency.       No pneumaturia or foul odor of his urine  All other systems reviewed and are negative.  All systems reviewed and otherwise negative except for as above  Family History  Family history unknown: Yes  History reviewed. No pertinent past medical history.  History reviewed. No pertinent surgical history.  Social History:  reports that he has never smoked. He has never used smokeless tobacco. He reports current alcohol use of about 4.0 standard drinks of alcohol per week. No history on file for drug.  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 He denies alcohol use since that time  Allergies: No Known Allergies  (Not in a hospital admission)   Prior to Admission medications   Not on File    Blood pressure 94/70, pulse (!) 55, temperature 98.6 F (37 C), temperature source Oral, resp. rate 16, height 5' (1.524 m), weight 66.7 kg, SpO2 98 %. Physical Exam: General: pleasant, WD/WN Hispanic male who is laying in bed in NAD HEENT: head is normocephalic, atraumatic.  Sclera are noninjected.  Pupils equal and round.  Ears and nose without any masses or lesions.  Mouth is pink and moist. Dentition fair Heart: regular, rate, and rhythm.  No obvious murmurs, gallops, or rubs noted.  Palpable pedal pulses bilaterally Lungs: CTAB, no wheezes, rhonchi, or rales noted.  Respiratory effort nonlabored Abd: Soft, ND, tenderness of the LLQ. No rebound, rigidity or guarding. No peritonitis +BS, no masses, hernias, or organomegaly MS: all 4 extremities are symmetrical with no cyanosis, clubbing, or edema. Skin: warm and dry with no masses, lesions, or rashes Psych: A&Ox3 with an appropriate affect. Neuro: cranial nerves grossly intact, extremity CSM intact bilaterally, normal speech  Results for orders placed or performed during the hospital encounter of 06/27/18 (from the past 48 hour(s))  CBC with Differential     Status: Abnormal   Collection Time: 06/27/18  6:33 PM  Result Value Ref Range   WBC 11.0 (H) 4.0 - 10.5 K/uL   RBC 5.32 4.22 - 5.81 MIL/uL   Hemoglobin 15.5 13.0 - 17.0 g/dL   HCT 76.1 51.8 - 34.3 %   MCV 85.9 80.0 - 100.0 fL   MCH 29.1 26.0 - 34.0 pg   MCHC 33.9 30.0 - 36.0 g/dL   RDW 73.5 78.9 - 78.4 %   Platelets 324 150 - 400 K/uL   nRBC 0.0 0.0 - 0.2 %   Neutrophils Relative % 71 %   Neutro Abs 7.9 (H) 1.7 - 7.7 K/uL   Lymphocytes Relative 20 %   Lymphs Abs 2.2 0.7 - 4.0 K/uL   Monocytes Relative 8 %   Monocytes Absolute 0.8 0.1 - 1.0 K/uL   Eosinophils Relative 1 %    Eosinophils Absolute 0.1 0.0 - 0.5 K/uL   Basophils Relative 0 %   Basophils Absolute 0.0 0.0 - 0.1 K/uL   Immature Granulocytes 0 %   Abs Immature Granulocytes 0.03 0.00 - 0.07 K/uL    Comment: Performed at [facility], [address], [place], [place] [address]  Comprehensive metabolic panel     Status: None   Collection Time: 06/27/18  6:33 PM  Result Value Ref Range   Sodium 138 135 - 145 mmol/L   Potassium 4.1 3.5 - 5.1 mmol/L   Chloride 103 98 - 111 mmol/L   CO2 26 22 - 32 mmol/L   Glucose, Bld 97 70 - 99 mg/dL   BUN 16 6 - 20 mg/dL   Creatinine, Ser 8.20 0.61 - 1.24 mg/dL   Calcium 9.3 8.9 - 81.3 mg/dL  Total Protein 7.8 6.5 - 8.1 g/dL   Albumin 3.8 3.5 - 5.0 g/dL   AST 19 15 - 41 U/L   ALT 17 0 - 44 U/L   Alkaline Phosphatase 86 38 - 126 U/L   Total Bilirubin 0.8 0.3 - 1.2 mg/dL   GFR calc non Af Amer >60 >60 mL/min   GFR calc Af Amer >60 >60 mL/min   Anion gap 9 5 - 15    Comment: Performed at [facility], [address] [place], [place] [address]  Urinalysis, Routine w reflex microscopic     Status: Abnormal   Collection Time: 06/28/18  2:45 AM  Result Value Ref Range   Color, Urine YELLOW YELLOW   APPearance CLEAR CLEAR   Specific Gravity, Urine 1.013 1.005 - 1.030   pH 6.0 5.0 - 8.0   Glucose, UA NEGATIVE NEGATIVE mg/dL   Hgb urine dipstick NEGATIVE NEGATIVE   Bilirubin Urine NEGATIVE NEGATIVE   Ketones, ur 20 (A) NEGATIVE mg/dL   Protein, ur NEGATIVE NEGATIVE mg/dL   Nitrite NEGATIVE NEGATIVE   Leukocytes,Ua NEGATIVE NEGATIVE    Comment: Performed at [facility], [address] [place], [place] [address]  SARS Coronavirus 2 (CEPHEID - Performed in [facility] hospital lab), Hosp Order     Status: None   Collection Time: 06/28/18  4:35 AM  Result Value Ref Range   SARS Coronavirus 2 NEGATIVE NEGATIVE    Comment: (NOTE) If result is NEGATIVE SARS-CoV-2 target nucleic acids are NOT DETECTED. The SARS-CoV-2 RNA is generally detectable  in upper and lower  respiratory specimens during the acute phase of infection. The lowest  concentration of SARS-CoV-2 viral copies this assay can detect is 250  copies / mL. A negative result does not preclude SARS-CoV-2 infection  and should not be used as the sole basis for treatment or other  patient management decisions.  A negative result may occur with  improper specimen collection / handling, submission of specimen other  than nasopharyngeal swab, presence of viral mutation(s) within the  areas targeted by this assay, and inadequate number of viral copies  (<250 copies / mL). A negative result must be combined with clinical  observations, patient history, and epidemiological information. If result is POSITIVE SARS-CoV-2 target nucleic acids are DETECTED. The SARS-CoV-2 RNA is generally detectable in upper and lower  respiratory specimens dur ing the acute phase of infection.  Positive  results are indicative of active infection with SARS-CoV-2.  Clinical  correlation with patient history and other diagnostic information is  necessary to determine patient infection status.  Positive results do  not rule out bacterial infection or co-infection with other viruses. If result is PRESUMPTIVE POSTIVE SARS-CoV-2 nucleic acids MAY BE PRESENT.   A presumptive positive result was obtained on the submitted specimen  and confirmed on repeat testing.  While 2019 novel coronavirus  (SARS-CoV-2) nucleic acids may be present in the submitted sample  additional confirmatory testing may be necessary for epidemiological  and / or clinical management purposes  to differentiate between  SARS-CoV-2 and other Sarbecovirus currently known to infect humans.  If clinically indicated additional testing with an alternate test  methodology [phone]) is advised. The SARS-CoV-2 RNA is generally  detectable in upper and lower respiratory sp ecimens during the acute  phase of infection. The expected result is  Negative. Fact Sheet for Patients:  BoilerBrush.com.cy Fact Sheet for Healthcare Providers: https://pope.com/ This test is not yet approved or cleared by the  [place] [employer] trader and has been authorized for detection and/or diagnosis of SARS-CoV-2 by FDA under an TEFL teacher (EUA).  This EUA will remain in effect (meaning this test can be used) for the duration of the COVID-19 declaration under Section 564(b)(1) of the Act, 21 U.S.C. section 360bbb-3(b)(1), unless the authorization is terminated or revoked sooner. Performed at [facility], [address] [place] [address] [place], [place] [address]    Ct Abdomen Pelvis Wo Contrast  Result Date: 06/28/2018 CLINICAL DATA:  48 year old male with abdominal pain. Concern for acute diverticulitis. EXAM: CT ABDOMEN AND PELVIS WITHOUT CONTRAST TECHNIQUE: Multidetector CT imaging of the abdomen and pelvis was performed following the standard protocol without IV contrast. COMPARISON:  CT of the abdomen pelvis dated 04/15/2018 FINDINGS: Evaluation of this exam is limited in the absence of intravenous contrast. Lower chest: There is a 5 mm right lung base subpleural calcified granuloma. The visualized lung bases are otherwise clear. No intra-abdominal free air or free fluid. Hepatobiliary: No focal liver abnormality is seen. No gallstones, gallbladder wall thickening, or biliary dilatation. Pancreas: Unremarkable. No pancreatic ductal dilatation or surrounding inflammatory changes. Spleen: Normal in size without focal abnormality. Adrenals/Urinary Tract: The adrenal glands are unremarkable. There is a punctate nonobstructing right renal interpolar calculus. No hydronephrosis. The left kidney is unremarkable. The visualized ureters and urinary bladder appear unremarkable. Stomach/Bowel: There is extensive sigmoid diverticulosis with muscular hypertrophy. There is active inflammatory changes of the sigmoid colon  consistent with acute diverticulitis. There is loss of fat plane between the sigmoid colon and dome of the bladder with thickened appearance of the bladder dome consistent with adhesions. An early colovesical fistula is not entirely excluded. However, no gas is identified within the bladder at this time. There is a 1.4 x 1.2 cm complex collection along the inferior aspect of the sigmoid colon in the superior portion of the colovesical adhesion (coronal series 7, image 42). This may represent a diverticular abscess or extension of colonic air and fluid into the partially cannulized colovesical fistula. There is no bowel obstruction. The appendix is normal. Vascular/Lymphatic: The abdominal aorta and IVC are grossly unremarkable on this noncontrast CT. No portal venous gas. There is no adenopathy. Reproductive: The prostate and seminal vesicles are grossly unremarkable. No pelvic mass. Other: Small fat containing umbilical hernia. Musculoskeletal: No acute or significant osseous findings. IMPRESSION: 1. Sigmoid diverticulitis with findings of colovesical adhesion or possible early fistulization. A complex collection along the inferior aspect of the sigmoid colon in the superior portion of the colovesical adhesion may represent a diverticular abscess or extension of colonic air and fluid into the partially cannulizedal fistula. 2. Punctate nonobstructing right renal interpolar stone. No hydronephrosis. Electronically Signed   By: Elgie Collard M.D.   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  He did have a recent episode on 04/15/2018 that he was treated with IV and oral antibiotics after being seen at [facility].  He denies symptoms of smoldering diverticulitis.  He denies follow-up with a surgeon as an outpatient or ever having a colonoscopy done.   We discussed treatment of his diverticulitis while in the hospital.  We discussed that hopefully he will improve with IV antibiotics along w/ bowel rest, pain medication & IV fluids.  We did discuss the possibility of requiring a colectomy and colostomy while in the hospital.  I also discussed with him that if his symptoms were to resolve with antibiotics during this hospital stay we would recommend him have a follow-up colonoscopy in 6-8 weeks and follow-up with one of our colorectal surgeons as an outpatient to discuss possibility of a elective colectomy given this is his second flare in 3 months and he now is showing signs of a early fistulization on CT.  The patient is also complaining of some urinary symptoms with this.  His UA does not appear to be infected.  Will monitor this as this is a new symptom for the patient compared to prior episodes of his diverticulitis. His CT scan findings also report possibility of an abscess vs air from partially cannulized fistula. This measures 1.4x1.2cm and would be to small for drainage. There is currently no indication for emergent surgery. We will continue to follow along.  - Agree w/ IV Zosyn - Bowel rest - Mobilize and IS  ID - Cipro/Flagyl x 1 in ED 6/3. Zosyn 6/3 >> WBC 11,000 VTE - SCDs, okay for chemical prophylaxis from a surgical standpoint FEN - NPO except ice chips and sips with meds, IVF, bowel rest Foley - None Follow up - TBD COVID screening - Negative   Jacinto HalimMichael M Bessye Stith, [facility] 06/28/2018, 8:25 AM Pager: [phone]

## 2018-06-29 ENCOUNTER — Telehealth: Payer: Self-pay | Admitting: Surgery

## 2018-06-29 LAB — COMPREHENSIVE METABOLIC PANEL
ALT: 14 U/L (ref 0–44)
AST: 14 U/L — ABNORMAL LOW (ref 15–41)
Albumin: 2.7 g/dL — ABNORMAL LOW (ref 3.5–5.0)
Alkaline Phosphatase: 66 U/L (ref 38–126)
Anion gap: 9 (ref 5–15)
BUN: 6 mg/dL (ref 6–20)
CO2: 22 mmol/L (ref 22–32)
Calcium: 8.3 mg/dL — ABNORMAL LOW (ref 8.9–10.3)
Chloride: 108 mmol/L (ref 98–111)
Creatinine, Ser: 0.93 mg/dL (ref 0.61–1.24)
GFR calc Af Amer: >60 mL/min (ref >60)
GFR calc non Af Amer: >60 mL/min (ref >60)
Glucose, Bld: 122 mg/dL — ABNORMAL HIGH (ref 70–99)
Potassium: 3.6 mmol/L (ref 3.5–5.1)
Sodium: 139 mmol/L (ref 135–145)
Total Bilirubin: 0.9 mg/dL (ref 0.3–1.2)
Total Protein: 5.6 g/dL — ABNORMAL LOW (ref 6.5–8.1)

## 2018-06-29 LAB — CBC WITH DIFFERENTIAL/PLATELET
Abs Immature Granulocytes: 0 10*3/uL (ref 0.00–0.07)
Basophils Absolute: 0 10*3/uL (ref 0.0–0.1)
Basophils Relative: 1 %
Eosinophils Absolute: 0.1 10*3/uL (ref 0.0–0.5)
Eosinophils Relative: 2 %
HCT: 40.5 % (ref 39.0–52.0)
Hemoglobin: 13.9 g/dL (ref 13.0–17.0)
Immature Granulocytes: 0 %
Lymphocytes Relative: 44 %
Lymphs Abs: 1.6 10*3/uL (ref 0.7–4.0)
MCH: 29.1 pg (ref 26.0–34.0)
MCHC: 34.3 g/dL (ref 30.0–36.0)
MCV: 84.7 fL (ref 80.0–100.0)
Monocytes Absolute: 0.3 10*3/uL (ref 0.1–1.0)
Monocytes Relative: 10 %
Neutro Abs: 1.5 10*3/uL — ABNORMAL LOW (ref 1.7–7.7)
Neutrophils Relative %: 43 %
Platelets: 275 10*3/uL (ref 150–400)
RBC: 4.78 MIL/uL (ref 4.22–5.81)
RDW: 11.8 % (ref 11.5–15.5)
WBC: 3.5 10*3/uL — ABNORMAL LOW (ref 4.0–10.5)
nRBC: 0 % (ref 0.0–0.2)

## 2018-06-29 LAB — GLUCOSE, CAPILLARY
Glucose-Capillary: 100 mg/dL — ABNORMAL HIGH (ref 70–99)
Glucose-Capillary: 142 mg/dL — ABNORMAL HIGH (ref 70–99)

## 2018-06-29 MED ORDER — SODIUM CHLORIDE 0.9 % IV SOLN
Freq: Once | INTRAVENOUS | Status: AC
  Administered 2018-06-29: via INTRAVENOUS

## 2018-06-29 MED ORDER — SODIUM CHLORIDE 0.9 % IV SOLN
Freq: Once | INTRAVENOUS | Status: AC
  Administered 2018-06-29: 11:00:00 via INTRAVENOUS

## 2018-06-29 NOTE — Telephone Encounter (Signed)
-----   Message from Ancil Linsey, MD sent at 06/28/2018 10:08 PM EDT ----- Regarding: Clinic follow-up for colonoscopy and laparoscopic colectomy Hi,  I took care of this patient at [facility] for his recurrent sigmoid colon diverticulitis, and he was supposed to follow up with me outpatient for colonoscopy and laparoscopic colectomy, but he did not follow up once he felt better. He presented again early this morning, but this time to [facility] ER, who called me. I offered to accept transfer of patient to [facility], but patient said he wanted to stay there for antibiotics since he was already there and then follow up with me for surgery.  [facility] was consulted and is (completely unethically) telling my patient to follow up with them instead. Can somebody please call the patient before the end of this week and schedule for him a follow up appointment with me. Thank you.           Barbara Cower

## 2018-06-29 NOTE — Telephone Encounter (Signed)
Spoke with the patient said he was still in the hospital and would call when he got discharged.

## 2018-06-29 NOTE — Progress Notes (Signed)
PROGRESS NOTE    Jeffrey Charles  WUJ:[id number] DOB: [birth year] DOA: 06/27/2018 PCP: Patient, No Pcp Per (Confirm with patient/family/NH records and if not entered, this HAS to [facility]. "No PCP" if truly none.)   Brief Narrative: Jeffrey Charles is a 48 y.o. male with no significant past medical history admitted at [facility] in March 2020 3 months ago for diverticulitis with abscess at that time patient was treated conservatively with oral antibiotics presents to the ER with complaints of abdominal pain.  Patient has been having left lower quadrant abdominal pain for the last 4 days.  Denies any nausea vomiting diarrhea or any discolored urine.  Denies fever chills.  Pain is constant not related to food. In the ER patient was afebrile and labs show mild leukocytosis.  CT of the abdomen and pelvis done shows left sided diverticulitis with possible abscess and fistulization.  ER physician had discussed with surgeon at [facility] but [facility].  Assessment & Plan:   Principal Problem:   Diverticulitis Active Problems:   Diverticulitis of large intestine with abscess without bleeding  Sigmoid diverticulitis with possible early fistulization and possible abscess, POA, ongoing  -Surgery following, appreciate insight/recommendations - no indication for procedure at this time  -Imaging remarkable for possible early fistula versus abscess, too small for intervention  -Continue IV fluids, advance diet as tolerated - currently on clears  -Continue Zosyn(started 06/28/2018), previously resolved with outpatient with Cipro/Flagyl earlier this year for similar episode  -Patient does not meet sepsis criteria at this time  -Currently holding off on procedure per surgery, no emergent or urgent findings  -Patient was to follow up with Dr. Bess KindsKimrey for outpatient eval/colonscopy but never  followed up - will continue to impress upon the patient the importance of close follow up after DC otherwise he might have further recurrent issues  DVT prophylaxis: Lovenox Code Status: Full Disposition Plan: Pending clinical status, suspect additional 48-72 hours of hospital day for IV antibiotics, bowel rest and further surgical evaluation.  Pending clinical improvement suspect further advancement of diet and p.o. antibiotics in 48 to 72 hours, if he tolerates these well we would suspect discharge home thereafter.  If clinically worsening would consider further discussion with surgery for possible intervention which would likely prolong patient stay given his multiple diverticular flares over the past few months  Consultants:   General surgery  Procedures: None planned currently   Antimicrobials: Zosyn, start 06/28/2018    Subjective: No acute issues or events overnight, abdominal pain moderately improved with current regimen.  Denies fevers, chills, headache, chest pain, shortness of breath.  Objective: Vitals:   06/28/18 2116 06/29/18 0627 06/29/18 0804 06/29/18 1403  BP: 121/80 96/64  111/71  Pulse: 61 (!) 48  (!) 56  Resp: 16 14  12   Temp: 98.6 F (37 C) 98.4 F (36.9 C)  98.1 F (36.7 C)  TempSrc: Oral Oral  Oral  SpO2: 98% 99%  100%  Weight:   69.2 kg   Height:        Intake/Output Summary (Last 24 hours) at 06/29/2018 1549 Last data filed at 06/29/2018 1300 Gross per 24 hour  Intake 542.45 ml  Output -  Net 542.45 ml   Filed Weights   06/28/18 0023 06/29/18 0804  Weight: 66.7 kg 69.2 kg    Examination:  General exam: Appears calm and comfortable  Respiratory system: Clear to auscultation.  Respiratory effort normal. Cardiovascular system: S1 & S2 heard, RRR. No JVD, murmurs, rubs, gallops or clicks. No pedal edema. Gastrointestinal system: Abdomen is nondistended, soft and nontender. No organomegaly or masses felt. Normal bowel sounds heard. Central nervous  system: Alert and oriented. No focal neurological deficits. Extremities: Symmetric 5 x 5 power. Skin: No rashes, lesions or ulcers Psychiatry: Judgement and insight appear normal. Mood & affect appropriate.     Data Reviewed: I have personally reviewed following labs and imaging studies  CBC: Recent Labs  Lab 06/27/18 1833 06/29/18 0608  WBC 11.0* 3.5*  NEUTROABS 7.9* 1.5*  HGB 15.5 13.9  HCT 45.7 40.5  MCV 85.9 84.7  PLT 324 275   Basic Metabolic Panel: Recent Labs  Lab 06/27/18 1833 06/29/18 0608  NA 138 139  K 4.1 3.6  CL 103 108  CO2 26 22  GLUCOSE 97 122*  BUN 16 6  CREATININE 0.94 0.93  CALCIUM 9.3 8.3*   GFR: Estimated Creatinine Clearance: 78.4 mL/min (by C-G formula based on SCr of 0.93 mg/dL). Liver Function Tests: Recent Labs  Lab 06/27/18 1833 06/29/18 0608  AST 19 14*  ALT 17 14  ALKPHOS 86 66  BILITOT 0.8 0.9  PROT 7.8 5.6*  ALBUMIN 3.8 2.7*   No results for input(s): LIPASE, AMYLASE in the last 168 hours. No results for input(s): AMMONIA in the last 168 hours. Coagulation Profile: No results for input(s): INR, PROTIME in the last 168 hours. Cardiac Enzymes: No results for input(s): CKTOTAL, CKMB, CKMBINDEX, TROPONINI in the last 168 hours. BNP (last 3 results) No results for input(s): PROBNP in the last 8760 hours. HbA1C: No results for input(s): HGBA1C in the last 72 hours. CBG: Recent Labs  Lab 06/28/18 0832 06/28/18 2316 06/29/18 0736  GLUCAP 85 97 100*   Lipid Profile: No results for input(s): CHOL, HDL, LDLCALC, TRIG, CHOLHDL, LDLDIRECT in the last 72 hours. Thyroid Function Tests: No results for input(s): TSH, T4TOTAL, FREET4, T3FREE, THYROIDAB in the last 72 hours. Anemia Panel: No results for input(s): VITAMINB12, FOLATE, FERRITIN, TIBC, IRON, RETICCTPCT in the last 72 hours. Sepsis Labs: No results for input(s): PROCALCITON, LATICACIDVEN in the last 168 hours.  Recent Results (from the past 240 hour(s))  SARS  Coronavirus 2 (CEPHEID - Performed in [facility] hospital lab), Hosp Order     Status: None   Collection Time: 06/28/18  4:35 AM  Result Value Ref Range Status   SARS Coronavirus 2 NEGATIVE NEGATIVE Final    Comment: (NOTE) If result is NEGATIVE SARS-CoV-2 target nucleic acids are NOT DETECTED. The SARS-CoV-2 RNA is generally detectable in upper and lower  respiratory specimens during the acute phase of infection. The lowest  concentration of SARS-CoV-2 viral copies this assay can detect is 250  copies / mL. A negative result does not preclude SARS-CoV-2 infection  and should not be used as the sole basis for treatment or other  patient management decisions.  A negative result may occur with  improper specimen collection / handling, submission of specimen other  than nasopharyngeal swab, presence of viral mutation(s) within the  areas targeted by this assay, and inadequate number of viral copies  (<250 copies / mL). A negative result must be combined with clinical  observations, patient history, and epidemiological information. If result is POSITIVE SARS-CoV-2 target nucleic acids are DETECTED. The SARS-CoV-2 RNA is generally detectable in upper and lower  respiratory specimens dur ing the acute phase of infection.  Positive  results are indicative of  active infection with SARS-CoV-2.  Clinical  correlation with patient history and other diagnostic information is  necessary to determine patient infection status.  Positive results do  not rule out bacterial infection or co-infection with other viruses. If result is PRESUMPTIVE POSTIVE SARS-CoV-2 nucleic acids MAY BE PRESENT.   A presumptive positive result was obtained on the submitted specimen  and confirmed on repeat testing.  While 2019 novel coronavirus  (SARS-CoV-2) nucleic acids may be present in the submitted sample  additional confirmatory testing may be necessary for epidemiological  and / or clinical management purposes  to  differentiate between  SARS-CoV-2 and other Sarbecovirus currently known to infect humans.  If clinically indicated additional testing with an alternate test  methodology [id number]) is advised. The SARS-CoV-2 RNA is generally  detectable in upper and lower respiratory sp ecimens during the acute  phase of infection. The expected result is Negative. Fact Sheet for Patients:  BoilerBrush.com.cy Fact Sheet for Healthcare Providers: https://pope.com/ This test is not yet approved or cleared by the [place] FDA and has been authorized for detection and/or diagnosis of SARS-CoV-2 by FDA under an Emergency Use Authorization (EUA).  This EUA will remain in effect (meaning this test can be used) for the duration of the COVID-19 declaration under Section 564(b)(1) of the Act, 21 U.S.C. section 360bbb-3(b)(1), unless the authorization is terminated or revoked sooner. Performed at [facility], [address], [place], [place] [address]          Radiology Studies: Ct Abdomen Pelvis Wo Contrast  Result Date: 06/28/2018 CLINICAL DATA:  48 year old male with abdominal pain. Concern for acute diverticulitis. EXAM: CT ABDOMEN AND PELVIS WITHOUT CONTRAST TECHNIQUE: Multidetector CT imaging of the abdomen and pelvis was performed following the standard protocol without IV contrast. COMPARISON:  CT of the abdomen pelvis dated 04/15/2018 FINDINGS: Evaluation of this exam is limited in the absence of intravenous contrast. Lower chest: There is a 5 mm right lung base subpleural calcified granuloma. The visualized lung bases are otherwise clear. No intra-abdominal free air or free fluid. Hepatobiliary: No focal liver abnormality is seen. No gallstones, gallbladder wall thickening, or biliary dilatation. Pancreas: Unremarkable. No pancreatic ductal dilatation or surrounding inflammatory changes. Spleen: Normal in size without focal abnormality.  Adrenals/Urinary Tract: The adrenal glands are unremarkable. There is a punctate nonobstructing right renal interpolar calculus. No hydronephrosis. The left kidney is unremarkable. The visualized ureters and urinary bladder appear unremarkable. Stomach/Bowel: There is extensive sigmoid diverticulosis with muscular hypertrophy. There is active inflammatory changes of the sigmoid colon consistent with acute diverticulitis. There is loss of fat plane between the sigmoid colon and dome of the bladder with thickened appearance of the bladder dome consistent with adhesions. An early colovesical fistula is not entirely excluded. However, no gas is identified within the bladder at this time. There is a 1.4 x 1.2 cm complex collection along the inferior aspect of the sigmoid colon in the superior portion of the colovesical adhesion (coronal series 7, image 42). This may represent a diverticular abscess or extension of colonic air and fluid into the partially cannulized colovesical fistula. There is no bowel obstruction. The appendix is normal. Vascular/Lymphatic: The abdominal aorta and IVC are grossly unremarkable on this noncontrast CT. No portal venous gas. There is no adenopathy. Reproductive: The prostate and seminal vesicles are grossly unremarkable. No pelvic mass. Other: Small fat containing umbilical hernia. Musculoskeletal: No acute or significant osseous findings. IMPRESSION: 1. Sigmoid diverticulitis with findings of colovesical adhesion or possible  early fistulization. A complex collection along the inferior aspect of the sigmoid colon in the superior portion of the colovesical adhesion may represent a diverticular abscess or extension of colonic air and fluid into the partially cannulizedal fistula. 2. Punctate nonobstructing right renal interpolar stone. No hydronephrosis. Electronically Signed   By: Elgie Collard M.D.   On: 06/28/2018 02:46        Scheduled Meds: . enoxaparin (LOVENOX) injection   40 mg Subcutaneous Q24H   Continuous Infusions: . piperacillin-tazobactam (ZOSYN)  IV 3.375 g (06/29/18 1434)     LOS: 1 day    Time spent:    Azucena Fallen, DO [facility] Pager [phone]  If 7PM-7AM, please contact night-coverage [email or web address] Password [facility] 06/29/2018, 3:49 PM

## 2018-06-29 NOTE — Plan of Care (Signed)

## 2018-06-29 NOTE — Progress Notes (Addendum)
Subjective: CC: Abdominal pain Patient reports that his abdominal pain has improved. Is currently at 1.5/10 and located in his LLQ. Reports at time it will be a 3/10 but never above this. He is tolerating ice chips without N/V or increased pain. Reports small, loose BM yesterday and this morning. He continues to pass flatus. He was out of bed in chair most of yesterday and ambulated in room. He continues to have some dysuria but reports that this has also improved. No pneumaturia.   Objective: Vital signs in last 24 hours: Temp:  [97.6 F (36.4 C)-98.6 F (37 C)] 98.4 F (36.9 C) (06/04 0627) Pulse Rate:  [48-61] 48 (06/04 0627) Resp:  [14-16] 14 (06/04 0627) BP: (96-121)/(64-80) 96/64 (06/04 0627) SpO2:  [97 %-100 %] 99 % (06/04 0627) Weight:  [69.2 kg] 69.2 kg (06/04 0804) Last BM Date: 06/28/18  Intake/Output from previous day: 06/03 0701 - 06/04 0700 In: 422.5 [I.V.:372.5; IV Piggyback:50] Out: -  Intake/Output this shift: No intake/output data recorded.  PE: Gen: Awake and alert, NAD Heart: RRR Lungs: CTA b/l, normal effort Abd: Soft, ND, mild tenderness of the LLQ. +BS. No r/r/g or signs of peritonitis.  Msk: No edema  Lab Results:  Recent Labs    06/27/18 1833 06/29/18 0608  WBC 11.0* 3.5*  HGB 15.5 13.9  HCT 45.7 40.5  PLT 324 275   BMET Recent Labs    06/27/18 1833 06/29/18 0608  NA 138 139  K 4.1 3.6  CL 103 108  CO2 26 22  GLUCOSE 97 122*  BUN 16 6  CREATININE 0.94 0.93  CALCIUM 9.3 8.3*   PT/INR No results for input(s): LABPROT, INR in the last 72 hours. CMP     Component Value Date/Time   NA 139 06/29/2018 0608   NA 138 03/30/2012 1933   K 3.6 06/29/2018 0608   K 4.0 03/30/2012 1933   CL 108 06/29/2018 0608   CL 104 03/30/2012 1933   CO2 22 06/29/2018 0608   CO2 24 03/30/2012 1933   GLUCOSE 122 (H) 06/29/2018 0608   GLUCOSE 93 03/30/2012 1933   BUN 6 06/29/2018 0608   BUN 13 03/30/2012 1933   CREATININE 0.93 06/29/2018  0608   CREATININE 0.79 03/30/2012 1933   CALCIUM 8.3 (L) 06/29/2018 0608   CALCIUM 8.5 03/30/2012 1933   PROT 5.6 (L) 06/29/2018 0608   PROT 7.7 03/30/2012 1933   ALBUMIN 2.7 (L) 06/29/2018 0608   ALBUMIN 3.9 03/30/2012 1933   AST 14 (L) 06/29/2018 0608   AST 26 03/30/2012 1933   ALT 14 06/29/2018 0608   ALT 53 03/30/2012 1933   ALKPHOS 66 06/29/2018 0608   ALKPHOS 89 03/30/2012 1933   BILITOT 0.9 06/29/2018 0608   BILITOT 0.6 03/30/2012 1933   GFRNONAA >60 06/29/2018 0608   GFRNONAA >60 03/30/2012 1933   GFRAA >60 06/29/2018 0608   GFRAA >60 03/30/2012 1933   Lipase     Component Value Date/Time   LIPASE 25 04/14/2018 2004       Studies/Results: Ct Abdomen Pelvis Wo Contrast  Result Date: 06/28/2018 CLINICAL DATA:  48 year old male with abdominal pain. Concern for acute diverticulitis. EXAM: CT ABDOMEN AND PELVIS WITHOUT CONTRAST TECHNIQUE: Multidetector CT imaging of the abdomen and pelvis was performed following the standard protocol without IV contrast. COMPARISON:  CT of the abdomen pelvis dated 04/15/2018 FINDINGS: Evaluation of this exam is limited in the absence of intravenous contrast. Lower chest: There is a  5 mm right lung base subpleural calcified granuloma. The visualized lung bases are otherwise clear. No intra-abdominal free air or free fluid. Hepatobiliary: No focal liver abnormality is seen. No gallstones, gallbladder wall thickening, or biliary dilatation. Pancreas: Unremarkable. No pancreatic ductal dilatation or surrounding inflammatory changes. Spleen: Normal in size without focal abnormality. Adrenals/Urinary Tract: The adrenal glands are unremarkable. There is a punctate nonobstructing right renal interpolar calculus. No hydronephrosis. The left kidney is unremarkable. The visualized ureters and urinary bladder appear unremarkable. Stomach/Bowel: There is extensive sigmoid diverticulosis with muscular hypertrophy. There is active inflammatory changes of the  sigmoid colon consistent with acute diverticulitis. There is loss of fat plane between the sigmoid colon and dome of the bladder with thickened appearance of the bladder dome consistent with adhesions. An early colovesical fistula is not entirely excluded. However, no gas is identified within the bladder at this time. There is a 1.4 x 1.2 cm complex collection along the inferior aspect of the sigmoid colon in the superior portion of the colovesical adhesion (coronal series 7, image 42). This may represent a diverticular abscess or extension of colonic air and fluid into the partially cannulized colovesical fistula. There is no bowel obstruction. The appendix is normal. Vascular/Lymphatic: The abdominal aorta and IVC are grossly unremarkable on this noncontrast CT. No portal venous gas. There is no adenopathy. Reproductive: The prostate and seminal vesicles are grossly unremarkable. No pelvic mass. Other: Small fat containing umbilical hernia. Musculoskeletal: No acute or significant osseous findings. IMPRESSION: 1. Sigmoid diverticulitis with findings of colovesical adhesion or possible early fistulization. A complex collection along the inferior aspect of the sigmoid colon in the superior portion of the colovesical adhesion may represent a diverticular abscess or extension of colonic air and fluid into the partially cannulizedal fistula. 2. Punctate nonobstructing right renal interpolar stone. No hydronephrosis. Electronically Signed   By: Elgie Collard M.D.   On: 06/28/2018 02:46    Anti-infectives: Anti-infectives (From admission, onward)   Start     Dose/Rate Route Frequency Ordered Stop   06/28/18 0730  piperacillin-tazobactam (ZOSYN) IVPB 3.375 g     3.375 g 12.5 mL/hr over 240 Minutes Intravenous Every 8 hours 06/28/18 0715     06/28/18 0330  ciprofloxacin (CIPRO) IVPB 400 mg     400 mg 200 mL/hr over 60 Minutes Intravenous  Once 06/28/18 0321 06/28/18 0620   06/28/18 0330  metroNIDAZOLE  (FLAGYL) IVPB 500 mg     500 mg 100 mL/hr over 60 Minutes Intravenous  Once 06/28/18 0321 06/28/18 0454       Assessment/Plan Sigmoid Diverticulitis with possible early fistulization to bladder and possible abscess - Does not appear to be amenable to IR drainage - Continue IV abx - Advance to CLD - Mobilize and IS - Hopefully this will get better with IV abx.  We discussed that if he does not improve with abx, he may need a colectomy during admission. I also discussed with him that if his symptoms were to resolve with antibiotics during this hospital stay we would recommend him have a follow-up colonoscopy in 6-8 weeks and follow-up with a colorectal surgeons as an outpatient to discuss possibility of a elective colectomy. He is in agreement with this.  ID - Cipro/Flagyl x 1 in ED 6/3. Zosyn 6/3 >> (day 2) WBC 11>3.5 VTE - SCDs, Lovenox  FEN - CLD, IVF,  Foley - None Follow up - Colonoscopy and Colorectal surgeon. It was discussed with the patient that he can follow up with  the surgeon [facility] or with one of our colorectal surgeons. COVID screening - Negative  POC - Daughter. Roxanna MewIsley Martinez [phone]   LOS: 1 day    Jacinto HalimMichael M Janesia Joswick , [facility] LPA-C [facility] 06/29/2018, 9:58 AM Pager: [phone]

## 2018-06-29 NOTE — Discharge Instructions (Signed)
Diverticulitis Diverticulitis  La diverticulitis es la infeccin o inflamacin de pequeas bolsas (divertculos) en el colon que se forman por una afeccin llamada diverticulosis. Los divertculos pueden atrapar las heces (materia fecal) y las bacterias, lo cual causa infeccin e inflamacin. La diverticulitis puede causar dolor intenso de estmago y [place]. Puede causar daos en los tejidos del colon que provocan sangrado. Los divertculos tambin pueden explotar (ruptura) y causar que las heces infectadas ingresen a otras reas del abdomen. Entre las complicaciones de la diverticulitis, se incluyen las siguientes:  Hemorragia.  Infeccin grave.  Dolor intenso.  Ruptura (perforacin) del colon.  Bloqueo (obstruccin) del colon. Cules son las causas? Esta afeccin es causada por las heces que quedan atrapadas en los divertculos, lo cual permite que las bacterias crezcan en ellos. Esto causa inflamacin e infeccin. Qu incrementa el riesgo? Es ms probable que tenga esta afeccin si:  Tiene diverticulosis. El riesgo de tener diverticulosis aumenta si usted: ? Tiene sobrepeso u es obeso. ? Consume productos que contienen tabaco. ? No realiza suficiente actividad fsica.  Lleva una dieta que no incluye la suficiente cantidad [place]. Los alimentos ricos en fibra incluyen frutas, verduras, frijoles, frutos secos y cereales integrales. Cules son los signos o los sntomas? Los sntomas de esta afeccin pueden incluir los siguientes:  Dolor y sensibilidad en el abdomen. El dolor en general se siente del lado izquierdo del abdomen, pero puede sentirse en otras zonas.  Grant Ruts y escalofros.  Meteorismo.  Calambres.  Nuseas.  Vmitos.  Cambios en los hbitos intestinales.  Sangre en las heces. Cmo se diagnostica? Esta afeccin se diagnostica en funcin de lo siguiente:  Sus antecedentes mdicos.  Un examen fsico.  Pruebas para descartar otros motivos que causen  la afeccin. Estos estudios pueden Johnson & Johnson siguientes: ? Anlisis de [place]. ? Anlisis de [place]. ? Estudios por imgenes del abdomen, entre ellos, radiografas, ecografas, resonancias magnticas (RM) o exploraciones por tomografa computarizada (TC). Cmo se trata? La mayora de los casos de esta afeccin son leves y pueden tratarse Facilities manager. El tratamiento puede incluir lo siguiente:  Tomar analgsicos de Sales promotion account executive.  Seguir una dieta lquida absoluta.  Tomar antibiticos por va oral.  Hacer reposo. Puede que los casos ms graves deban tratarse en el hospital. El tratamiento puede incluir lo siguiente:  No comer ni beber nada.  Tomar analgsicos recetados.  Recibir antibiticos a travs de un tubo (catter) intravenoso.  Recibir lquidos y alimentos a travs de un tubo (catter) intravenoso.  Ciruga. Cuando la afeccin est controlada, el mdico puede recomendarle que se haga una colonoscopia. Se trata de un examen que se realiza para examinar todo el intestino grueso. Durante el examen, se introduce un tubo lubricado y flexible en el ano que luego se lleva hasta el recto, el colon y otras partes del intestino grueso. Mediante una colonoscopia, se puede determinar la gravedad de los divertculos y si hay algo ms que pueda estar causando los sntomas. Siga estas indicaciones en su casa: Medicamentos  Baxter International de venta libre y los recetados solamente como se lo haya indicado el mdico. Estos incluyen suplementos de [place], probiticos y laxantes.  Si le recetaron un antibitico, tmelo como se lo haya indicado el mdico. No deje de tomar los antibiticos aunque comience a Actor.  No conduzca ni opere maquinaria pesada mientras toma analgsicos recetados. Instrucciones generales   Siga las indicaciones del mdico acerca de si debe seguir una dieta lquida o de otro tipo. Cuando  los sntomas mejoren, el mdico puede indicarle que modifique la  dieta. Puede recomendarle que consuma una dieta con al menos 25g (25gramos) de fibra a diario. La fibra facilita la eliminacin de heces. Las fuentes saludables de fibra incluyen: ? Frutos rojos. Una taza contiene de 4 a 8gramos de [place]. ? Frijoles y lentejas. Media taza contiene de 5 a 8gramos de [place]. ? Vegetales de Marriott. Una taza contiene 4gramos de [place].  Realizar al menos de actividad fsica diaria, 3veces por semana. El ejercicio debe tener la intensidad suficiente como para aumentar la frecuencia cardaca y Emergency planning/management officer.  Concurra a todas las visitas de [address] se lo haya indicado el mdico. Esto es importante. Es posible que necesite una colonoscopia. Comunquese con un mdico si:  El dolor no mejora.  Le resulta difcil beber o comer.  Las deposiciones no se normalizan. Solicite ayuda de inmediato si:  El Product/process development scientist.  Los sntomas no mejoran con Scientist, research (medical).  Los sntomas empeoran repentinamente.  Tiene fiebre.  Vomita ms de una vez.  Las heces son sanguinolentas, negras o alquitranadas. Resumen  La diverticulitis es la infeccin o inflamacin de pequeas bolsas (divertculos) en el colon que se forman por una afeccin llamada diverticulosis. Los divertculos pueden atrapar las heces (materia fecal) y las bacterias, lo cual causa infeccin e inflamacin.  Usted tiene un riesgo mayor de tener esta afeccin si sufre de diverticulosis y sigue una dieta que no incluye la suficiente cantidad de [place].  La mayora de los casos de esta afeccin son leves y pueden tratarse Facilities manager. Puede que los casos ms graves deban tratarse en el hospital.  Cuando la afeccin est controlada, el mdico puede recomendarle que se haga un examen llamado colonoscopia. Este examen puede mostrar la gravedad de los divertculos y si hay algo ms que pueda estar causando los sntomas. Esta informacin no tiene Theme park manager el consejo del mdico.  Asegrese de hacerle al mdico cualquier pregunta que tenga. Document Released: 10/21/2004 Document Revised: 05/03/2016 Document Reviewed: 05/03/2016 Elsevier Interactive Patient Education  2019 ArvinMeritor.    Plan de alimentacin con bajo contenido de [place]  (siga este plan de alimentacin durante las prximas 4 semanas) Low-Fiber Eating Plan (please follow this eating plan for the next 4 weeks) La fibra se [employer]ur[employer] frutas, las verduras, los cereales integrales y las legumbres. Consumir una dieta con bajo contenido de [place] a reducir la frecuencia de las deposiciones y la cantidad de cada deposicin. Un plan de alimentacin con bajo contenido de fibra puede ayudar a que su aparato digestivo se cure en los siguientes casos: Padece ciertas enfermedades, como la enfermedad de Crohn o diverticulitis. Recibi radioterapia recientemente en la pelvis o los intestinos. Acaba de someterse a una ciruga intestinal. Tiene un nuevo orificio quirrgico en el abdomen (colostoma o ileostoma). Tiene un estrechamiento de intestino (estenosis). El mdico determinar durante cunto tiempo deber seguir esta dieta. El mdico puede recomendarle que trabaje con un especialista en dietas y alimentacin (nutricionista). Consejos para seguir Goodrich Corporation plan [place] generales Siga las indicaciones de su nutricionista sobre la cantidad de fibra diaria que necesita incorporar. La mayora de las personas que siguen este plan de alimentacin deben tratar de consumir menos de 10gramos (g) de [employer] Northwestern Mutual. Su meta de cantidad de fibra diaria es ________________g. Tome los suplementos vitamnicos y Owens-Illinois se lo hayan indicado el mdico o el nutricionista. Las presentaciones lquidas o masticables son las mejores para  este plan de alimentacin. Leer las etiquetas de los alimentos Consulte las etiquetas de los alimentos para saber la cantidad de fibra alimentaria que contienen. Elija alimentos que tengan  menos de 2gramos de fibra por porcin. Coccin Use harina blanca y otros granos permitidos para hornear y Water quality scientistcocinar. Cocine la carne con mtodos que la dejen [place], como en estofado o hervida. Cocine los Colgate Palmolivehuevos hasta que la yema est bien slida. Elijas las grasas saludables, como el aceite de oliva o el aceite de canola. Planificacin de las comidas  Haga 5o6comidas pequeas durante el da, en lugar de 3comidas abundantes. Si es intolerante a la lactosa: Elija alimentos lcteos con bajo contenido de LaCrosselactosa. No consuma productos lcteos si as se lo indica su nutricionista. Limite las grasas y los aceites a menos de Chief Operating Officer8cucharaditas por da. Coma porciones pequeas de postres. Qu alimentos estn permitidos? Esta podra no ser Raytheonuna lista completa. Hable con el nutricionista sobre las mejores opciones alimentarias para usted. Carbohidratos Safeco Corporationodos los panes y las galletas saladas elaborados con harina blanca. Waffles, panqueques y tostadas francesas. Rosquillas. Pretzels. Tostadas Melba, biscote y Development worker, communitymatz. Cereales cocidos y secos que no contengan granos enteros, agregado de [place] River Fallsfibras, semillas o frutas disecadas. Harina de maz. Almidn. Cereales calientes y fros hechos con maz, trigo, arroz o avena refinados. Pasta sin relleno y fideos. Arroz blanco. Verduras Verduras bien cocidas o enlatadas sin semillas, piel ni tallos. Papas cocidas, sin cscara. Jugo de verduras. Nils PyleFrutas Frutas cocidas o enlatadas, sin semillas ni cscara. Banana madura pelada. Pur de Praxairmanzana. Jugos de frutas sin pulpa. Carnes y otros alimentos ricos en protenas Carne molida. Cortes tiernos de carne o aves. Huevos. Pescado, frutos de mar y Liberty Globalmariscos. Mantequilla suave de frutos secos. Tofu. Lcteos Las bebidas y los productos lcteos en su totalidad. Leches deslactosadas, incluidas las leches de Rougemontarroz, soja y Northamptonalmendras. Yogurt sin mezclas de frutas, frutos secos, chocolate o granola. PPG IndustriesCrema cida. CSX CorporationQueso cottage.  Queso. Bebidas Caf descafeinado. Jugos de frutas y verduras, o batidos (pequeas cantidades, sin pulpa ni piel, y con frutas de la lista permitida). Bebidas deportivas. T de hierbas. Grasas y [address] Aceite de Ocean Springsoliva, de canola, de Bajandasgirasol, de linasa y de semillas de South Barringtonuva. Mayonesa. Queso crema. Margarina. Mantequilla. Dulces y postres Bizcochuelos y Programmer, systemsgalletitas. Pasteles con crema y hechos con las frutas permitidas. Pudin. Natillas. Gelatina de frutas. Sorbete. Helados de agua. Helados sin frutos secos. Caramelos duros comunes. Miel. Gelatina. Melaza. Jarabes, incluido el jarabe de chocolate. Chocolate. Malvaviscos. Pastillas de goma. Condimentos y otros alimentos Consom. Caldos. Sopas crema hechas con los alimentos permitidos. Sopas coladas. Cazuelas preparadas con alimentos permitidos. [place]. Mostaza suave. Aderezos suaves para ensaladas. Salsas naturales. Vinagre. Especias con moderacin. Sal. Azcar. Qu alimentos no estn permitidos? Esta podra no ser Raytheonuna lista completa. Hable con el nutricionista sobre las mejores opciones alimentarias para usted. Carbohidratos Todo tipo de Hershey Companygalletas saladas y panes integrales y de grano entero. Panes y Designer, jewellerygalletas multicereales. Pan de centeno. Cereales integrales y multigrano. Cereales con frutos secos, pasas de uva o coco. Salvado. Cereales de grano grueso. Granola. Cereales con alto contenido de Overtonfibra. Pan de maz o de harina de maz. Pastas integrales. Arroz integral o arroz salvaje. Quinua. Palomitas de maz. Trigo sarraceno. Germen de trigo. Verduras Cscara de papas. Verduras crudas o semicocidas. Todas las legumbres y brotes de soja. Vegetales de Marriotthoja verde cocidos. Maz. Guisantes. Repollo. Remolachas. Brcoli. Repollitos de Bruselas. Coliflor. Hongos. Cebollas. Pimienta. Chiriva. Calal. Chucrut. Nils PyleFrutas Frutas crudas o disecadas. Frutos rojos. Jugo de frutas con  pulpa. Jugo de ciruelas. Carnes y otros alimentos ricos en protenas Carnes duras y  fibrosas con TEFL teacher. Carnes grasas. Carne de ave con piel. Carne de vaca, aves o pescado. Embutidos y fiambres. Salchichas, tocino y perritos calientes. Frutos secos y [place] con trozos de frutos secos. Arvejas secas, guisantes y lentejas. Lcteos Yogurt con mezclas de frutas, frutos secos, chocolate o granola. Bebidas T y caf no descafeinados. Grasas y Arts development officer. Coco. Lambert Mody y postres Postres, galletas dulces o golosinas que contengan frutos secos o [place]. Frutas desecadas. Jaleas y conservas sin semillas. Mermeladas. Cualquier postre hecho con frutas o granos que no estn permitidos. Condimentos y otros alimentos [address]. Sopas hechas con verduras o granos no permitidos. Salsa de pepinillos. Rbano picante. Pepinillos. Aceitunas. Resumen La Harley-Davidson de las personas que siguen un plan de alimentacin de bajo contenido de fibra deben tratar de consumir menos de 10gramos de Firefighter. Siga las indicaciones de su nutricionista sobre la cantidad de fibra diaria que necesita incorporar. Siempre controle las etiquetas de los alimentos para saber el contenido de fibra alimentaria de los alimentos envasados. En general, un alimento con bajo contenido de [place] tiene menos de 2gramos de fibra por porcin. Como regla general, trate de evitar los granos enteros, las frutas y verduras crudas, las frutas disecadas, los cortes duros de carnes, los frutos secos y las semillas. Tome un suplemento de vitaminas y Owens-Illinois se lo haya indicado el mdico o el nutricionista. Esta informacin no tiene Theme park manager el consejo del mdico. Asegrese de hacerle al mdico cualquier pregunta que tenga. Document Released: 01/11/2005 Document Revised: 08/20/2016 Document Reviewed: 08/20/2016 Elsevier Interactive Patient Education  2019 Elsevier Inc.   Dieta rica en fibra (comience esta dieta en 4 semanas despus de terminar su plan de alimentacin de dieta baja en fibra) High-Fiber Diet   (please start this diet in 4 weeks after finishing your low fiber diet eating plan) La fibra, tambin llamada fibra dietaria, es un tipo de carbohidrato que se encuentra en las frutas, las verduras, los cereales integrales y los frijoles. Una dieta rica en fibra puede tener muchos beneficios para la salud. El mdico puede recomendar una dieta rica en fibra para ayudar a:  Chief Strategy Officer. La fibra puede hacer que defeque con ms frecuencia.  Disminuir el nivel de colesterol.  Aliviar las siguientes afecciones: ? Hinchazn de la venas en el ano (hemorroides). ? Hinchazn e irritacin (inflamacin) de zonas especficas del tracto digestivo (diverticulitis sin complicaciones). ? Un problema del intestino grueso (colon) que, a veces, causa dolor y diarrea (sndrome de colon irritable, SCI).  Evitar comer en exceso como parte de un plan para bajar de peso.  Evitar la enfermedad cardaca, la diabetes tipo 2 y ciertos cnceres. En qu consiste el plan? El consumo diario recomendado de fibra en gramos (g) incluye lo siguiente:  38g para hombres de 50 aos o menos.  30g para los hombres [address] de [place].  25g para mujeres de 50 aos o menos.  21g para las Coca Cola de [place]. Puede recibir la ingesta diaria recomendada de fibra dietaria de la siguiente manera:  Coma una variedad de frutas, verduras, granos y frijoles.  Tome un suplemento de [place], si no es posible comer suficiente fibra en su dieta. Qu debo saber acerca de la dieta rica en fibra?  Es mejor obtener fibra directamente de los alimentos en lugar de recibirla de suplementos de [place]. No hay demasiada investigacin acerca de qu tan efectivos  son los suplementos.  Verifique siempre el contenido de fibra en la etiqueta de informacin nutricional de los alimentos preenvasados. Busque alimentos que contengan 5g o ms de fibra por porcin.  Hable con un especialista en alimentacin y nutricin  (nutricionista) si tiene preguntas sobre alimentos especficos que se recomiendan o no para su afeccin mdica, especialmente si aquellos alimentos no estn detallados a continuacin.  Aumente gradualmente la cantidad de fibra que consume. Si aumenta demasiado rpido el consumo de fibra dietaria puede provocar distensin abdominal, clicos o gases.  Beba abundante agua. El [place] a Geophysicist/field seismologist. Cules son algunos consejos para seguir este plan?  Consuma una gran variedad de alimentos ricos en fibra.  Asegrese de que al Coca-Cola mitad de los granos que ingiere cada da sean cereales integrales.  Coma panes y cereales hechos con harina de cereales integrales en lugar de harina refinada o blanca.  Coma arroz integral, trigo burgol o mijo en lugar de Surveyor, minerals con un desayuno rico en fibra, como un cereal que contenga al menos 5g de fibra o ms por porcin.  Use frijoles en lugar de carne en las sopas, ensaladas o platos de pastas.  Coma colaciones ricas en fibra, como frutos rojos, verduras crudas, frutos secos y palomitas de maz.  Elija frutas y verduras Environmental consultant de formas procesadas como jugo o salsa. Qu alimentos puedo comer?  Frutas Frutos rojos. Peras. Manzanas. Naranjas. Aguac[place]. Ciruelas y pasas. Higos secos. Verduras Batatas. Espinaca. Col rizada. Alcachofas. Repollo. Brcoli. Coliflor. Guisantes. Zanahorias. Calabaza. Cereales Panes integrales. Cereal multigrano. Avena. Arroz integral. Gypsy Decant. Trigo burgol. Mijo. Quinua. Magdalenas de salvado. Palomitas de maz. Galletas de centeno. Carnes y otras protenas Frijoles blancos, colorados y pintos. Soja. Guisantes secos. Lentejas. Frutos secos y semillas. Lcteos Yogur fortificado con Research scientist (life sciences). Bebidas Leche de soja fortificada con Bjorn Loser. Jugo de naranja fortificado con Bjorn Loser. Otros alimentos Barras de [place]. Es posible que los productos mencionados arriba no formen una lista completa  de las bebidas y los alimentos recomendados. Comunquese con un nutricionista para conocer ms opciones. Qu alimentos no se recomiendan? Frutas Jugo de frutas. Frutas cocidas coladas. Verduras Papas fritas. Verduras enlatadas. Verduras muy cocidas. Cereales Pan blanco. Pastas hechas con Webb Laws. Arroz Melburn Popper y otras protenas Cortes de carne con grasa. Pollo o pescado fritos. Lcteos Leche. Yogur. Queso crema. PPG Industries. Grasas y ITT Industries. Bebidas Gaseosas. Otros alimentos Tortas y pasteles. Es posible que los productos que se enumeran ms arriba no sean una lista completa de los alimentos y las bebidas que se Theatre stage manager. Comunquese con un nutricionista para obtener ms informacin. Resumen  La fibra es un tipo de carbohidrato. Se encuentra en las frutas, las verduras, los cereales integrales y los frijoles.  Una dieta rica en fibra representa muchos beneficios para la salud, como evitar el estreimiento, Copy colesterol en la [place], ayudar a perder peso y reducir el riesgo de enfermedad cardaca, diabetes y ciertos tipos de cncer.  Aumente gradualmente la ingesta de fibra. El aumento demasiado rpido puede provocar clicos, distensin abdominal y gases. Beba mucha agua a la vez que Hess Corporation.  Las mejores fuentes de fibra incluyen frutas y verduras enteras, granos integrales, frutos secos, semillas y frijoles. Esta informacin no tiene Theme park manager el consejo del mdico. Asegrese de hacerle al mdico cualquier pregunta que tenga. Document Released: 01/11/2005 Document Revised: 12/31/2016 Document Reviewed: 12/31/2016 Elsevier Interactive Patient Education  2019 Arvin[employer]itor.

## 2018-06-30 LAB — GLUCOSE, CAPILLARY
Glucose-Capillary: 109 mg/dL — ABNORMAL HIGH (ref 70–99)
Glucose-Capillary: 65 mg/dL — ABNORMAL LOW (ref 70–99)
Glucose-Capillary: 81 mg/dL (ref 70–99)

## 2018-06-30 LAB — CBC WITH DIFFERENTIAL/PLATELET
Abs Immature Granulocytes: 0.02 10*3/uL (ref 0.00–0.07)
Basophils Absolute: 0 10*3/uL (ref 0.0–0.1)
Basophils Relative: 1 %
Eosinophils Absolute: 0.1 10*3/uL (ref 0.0–0.5)
Eosinophils Relative: 2 %
HCT: 40.4 % (ref 39.0–52.0)
Hemoglobin: 13.8 g/dL (ref 13.0–17.0)
Immature Granulocytes: 0 %
Lymphocytes Relative: 44 %
Lymphs Abs: 2.2 10*3/uL (ref 0.7–4.0)
MCH: 29.1 pg (ref 26.0–34.0)
MCHC: 34.2 g/dL (ref 30.0–36.0)
MCV: 85.2 fL (ref 80.0–100.0)
Monocytes Absolute: 0.4 10*3/uL (ref 0.1–1.0)
Monocytes Relative: 8 %
Neutro Abs: 2.2 10*3/uL (ref 1.7–7.7)
Neutrophils Relative %: 45 %
Platelets: 291 10*3/uL (ref 150–400)
RBC: 4.74 MIL/uL (ref 4.22–5.81)
RDW: 11.8 % (ref 11.5–15.5)
WBC: 4.9 10*3/uL (ref 4.0–10.5)
nRBC: 0 % (ref 0.0–0.2)

## 2018-06-30 LAB — COMPREHENSIVE METABOLIC PANEL
ALT: 14 U/L (ref 0–44)
AST: 15 U/L (ref 15–41)
Albumin: 3 g/dL — ABNORMAL LOW (ref 3.5–5.0)
Alkaline Phosphatase: 66 U/L (ref 38–126)
Anion gap: 10 (ref 5–15)
BUN: 6 mg/dL (ref 6–20)
CO2: 23 mmol/L (ref 22–32)
Calcium: 8.6 mg/dL — ABNORMAL LOW (ref 8.9–10.3)
Chloride: 108 mmol/L (ref 98–111)
Creatinine, Ser: 0.93 mg/dL (ref 0.61–1.24)
GFR calc Af Amer: >60 mL/min (ref >60)
GFR calc non Af Amer: >60 mL/min (ref >60)
Glucose, Bld: 70 mg/dL (ref 70–99)
Potassium: 3.4 mmol/L — ABNORMAL LOW (ref 3.5–5.1)
Sodium: 141 mmol/L (ref 135–145)
Total Bilirubin: 0.6 mg/dL (ref 0.3–1.2)
Total Protein: 6 g/dL — ABNORMAL LOW (ref 6.5–8.1)

## 2018-06-30 MED ORDER — AMOXICILLIN-POT CLAVULANATE 875-125 MG PO TABS: 1.0000 | ORAL_TABLET | Freq: Two times a day (BID) | ORAL | 0 refills | Status: AC

## 2018-06-30 MED ORDER — POTASSIUM CHLORIDE CRYS ER 20 MEQ PO TBCR
40.0000 meq | EXTENDED_RELEASE_TABLET | Freq: Once | ORAL | Status: AC
  Administered 2018-06-30: 40 meq via ORAL
  Filled 2018-06-30: qty 2

## 2018-06-30 NOTE — Progress Notes (Signed)
Subjective: CC:  Patient reports abdominal pain has nearly resolved. He has a very mild, 1/10 pain in his LLQ. This no longer worsens at any time. He reports that he is tolerating a CLD without N/V or change in pain. Passing flatus. Last BM yesterday. Mobilizing halls. Asking when he can go home, he reports he feels ready.   Spanish interpretor was used   Objective: Vital signs in last 24 hours: Temp:  [97.6 F (36.4 C)-98.1 F (36.7 C)] 97.6 F (36.4 C) (06/05 0406) Pulse Rate:  [55-59] 55 (06/05 0406) Resp:  [12-18] 18 (06/05 0406) BP: (106-116)/(71-79) 106/77 (06/05 0406) SpO2:  [100 %] 100 % (06/05 0406) Last BM Date: 06/29/18  Intake/Output from previous day: 06/04 0701 - 06/05 0700 In: 240 [P.O.:240] Out: 1000 [Urine:1000] Intake/Output this shift: No intake/output data recorded.  PE: Gen: Awake and alert, NAD Heart: RRR Lungs: CTA b/l, normal effort Abd: Soft, ND, minimal tenderness of the LLQ. +BS. No r/r/g or signs of peritonitis.  Msk: No edema  Lab Results:  Recent Labs    06/29/18 0608 06/30/18 0425  WBC 3.5* 4.9  HGB 13.9 13.8  HCT 40.5 40.4  PLT 275 291   BMET Recent Labs    06/29/18 0608 06/30/18 0425  NA 139 141  K 3.6 3.4*  CL 108 108  CO2 22 23  GLUCOSE 122* 70  BUN 6 6  CREATININE 0.93 0.93  CALCIUM 8.3* 8.6*   PT/INR No results for input(s): LABPROT, INR in the last 72 hours. CMP     Component Value Date/Time   NA 141 06/30/2018 0425   NA 138 03/30/2012 1933   K 3.4 (L) 06/30/2018 0425   K 4.0 03/30/2012 1933   CL 108 06/30/2018 0425   CL 104 03/30/2012 1933   CO2 23 06/30/2018 0425   CO2 24 03/30/2012 1933   GLUCOSE 70 06/30/2018 0425   GLUCOSE 93 03/30/2012 1933   BUN 6 06/30/2018 0425   BUN 13 03/30/2012 1933   CREATININE 0.93 06/30/2018 0425   CREATININE 0.79 03/30/2012 1933   CALCIUM 8.6 (L) 06/30/2018 0425   CALCIUM 8.5 03/30/2012 1933   PROT 6.0 (L) 06/30/2018 0425   PROT 7.7 03/30/2012 1933   ALBUMIN  3.0 (L) 06/30/2018 0425   ALBUMIN 3.9 03/30/2012 1933   AST 15 06/30/2018 0425   AST 26 03/30/2012 1933   ALT 14 06/30/2018 0425   ALT 53 03/30/2012 1933   ALKPHOS 66 06/30/2018 0425   ALKPHOS 89 03/30/2012 1933   BILITOT 0.6 06/30/2018 0425   BILITOT 0.6 03/30/2012 1933   GFRNONAA >60 06/30/2018 0425   GFRNONAA >60 03/30/2012 1933   GFRAA >60 06/30/2018 0425   GFRAA >60 03/30/2012 1933   Lipase     Component Value Date/Time   LIPASE 25 04/14/2018 2004       Studies/Results: No results found.  Anti-infectives: Anti-infectives (From admission, onward)   Start     Dose/Rate Route Frequency Ordered Stop   06/28/18 0730  piperacillin-tazobactam (ZOSYN) IVPB 3.375 g     3.375 g 12.5 mL/hr over 240 Minutes Intravenous Every 8 hours 06/28/18 0715     06/28/18 0330  ciprofloxacin (CIPRO) IVPB 400 mg     400 mg 200 mL/hr over 60 Minutes Intravenous  Once 06/28/18 0321 06/28/18 0620   06/28/18 0330  metroNIDAZOLE (FLAGYL) IVPB 500 mg     500 mg 100 mL/hr over 60 Minutes Intravenous  Once 06/28/18 0321 06/28/18  [id number]       Assessment/Plan Sigmoid Diverticulitiswith possible early fistulization to bladder and possible abscess - Does not appear to be amenable to IR drainage -Continue IV abx - Advance to FLD - Mobilize and IS - Hopefully this will continue to get better with IV abx.  We discussed that if he does not improve with abx, he may need a colectomy during admission. I also discussed with him that if his symptoms were to resolvewith antibioticsduring this hospital staywe would recommend him have a follow-up colonoscopy in 6-8 weeks and follow-up with a colorectal surgeons as an outpatient or [facility] to discuss possibility of a elective colectomy. He is in agreement with this.  ID -Cipro/Flagyl x 1 in ED 6/3. Zosyn 6/3 >> (day 3) WBC 11>3.5>4.9 VTE -SCDs, Lovenox  FEN -FLD Foley -None Follow up -Colonoscopy and Colorectal surgeon. It was  discussed with the patient that he can follow up with [facility] or with one of our colorectal surgeons. POC - Daughter. Roxanna Mewsley Martinez [phone]    LOS: 2 days    Jacinto HalimMichael M Taquita Demby , [facility] 06/30/2018, 9:20 AM Pager: [phone]

## 2018-06-30 NOTE — Progress Notes (Signed)
Pt discharged home after going over discharge summary using the Spanish Interpreter. All questions addressed

## 2018-06-30 NOTE — Progress Notes (Signed)
PHARMACY NOTE -  Zosyn  Pharmacy has been assisting with dosing of Zosyn for sigmoid diverticulitiswith possible early fistulizationto bladderand possible abscess. Dosage remains stable at Zosyn 3.375g IV q8h (each dose infused over 4 hours) and need for further dosage adjustment appears unlikely at present.    Will sign off at this time.  Please reconsult if a change in clinical status warrants re-evaluation of dosage.   Greer Pickerel, PharmD, BCPS Clinical Pharmacist [phone] 06/30/2018 10:41 AM

## 2018-06-30 NOTE — Discharge Summary (Signed)
Physician Discharge Summary  Jeffrey Charles ZOX:[id number] DOB: 1970-07-16 DOA: 06/27/2018  PCP: Patient, No Pcp Per  Admit date: 06/27/2018 Discharge date: 06/30/2018  Disposition:  Home  Recommendations for Outpatient Follow-up:  1. Follow up with PCP in 1-2 weeks  Home Health: No  Discharge Condition: Stable CODE STATUS: Full Diet recommendation: Soft diet as tolerated  Brief/Interim Summary: Jeffrey Advani Hernandezis a 48 y.o.malewithno significant past medical history admitted at [facility] in March 2020 3 months ago for diverticulitis with abscess at that time patient was treated conservatively with oral antibiotics presents to the ER with complaints of abdominal pain. Patient has been having left lower quadrant abdominal pain for the last 4 days. Denies any nausea vomiting diarrhea or any discolored urine. Denies fever chills. Pain is constant not related to food. In the ER patient was afebrile and labs show mild leukocytosis. CT of the abdomen and pelvis done shows left sided diverticulitis with possible abscess and fistulization. ER physician had discussed with surgeon at [facility] but patient wants to be admitted at [facility].  Above for intractable abdominal pain nausea vomiting in the setting of diverticulitis with questionable abscess and fistulization.  Surgery following, recommending medical management at this point given patient's rapid improvement, now tolerating p.o. quite well will discharge patient home on remainder of antibiotics.  Patient had previous episode just 3 months ago for similar episode, was supposed to follow-up outpatient setting for lower endoscopy however this visit was skipped and patient quickly worsened over the past few weeks. This time patient appears well, stable and agreeable for discharge and close follow-up as described above.  Discharge Diagnoses:  Principal Problem:    Diverticulitis Active Problems:   Diverticulitis of large intestine with abscess without bleeding  Sigmoid diverticulitis with possible early fistulization and possible abscess, POA, resolving  -Surgery following, appreciate insight/recommendations - no indication for procedure at this time  -Imaging remarkable for possible early fistula versus abscess, too small for intervention  -Patient tolerating p.o. quite well, advancing diet, continue antibiotics with Augmentin at discharge to follow-up  -Outpatient colonoscopy to be scheduled for further evaluation and management of patient's recurrent diverticular disease  Discharge Instructions Please follow-up in the outpatient setting for colonoscopy as scheduled  Allergies as of 06/30/2018   No Known Allergies     Medication List    TAKE these medications   amoxicillin-clavulanate 875-125 MG tablet Commonly known as:  Augmentin Take 1 tablet by mouth 2 (two) times daily for 10 days.      Follow-up [employer] Follow up.   Why:  [facility] information: [address] [place] [address] [phone]          No Known Allergies  Consultations:  Gen Sx  Procedures/Studies: Ct Abdomen Pelvis Wo Contrast  Result Date: 06/28/2018 CLINICAL DATA:  48 year old male with abdominal pain. Concern for acute diverticulitis. EXAM: CT ABDOMEN AND PELVIS WITHOUT CONTRAST TECHNIQUE: Multidetector CT imaging of the abdomen and pelvis was performed following the standard protocol without IV contrast. COMPARISON:  CT of the abdomen pelvis dated 04/15/2018 FINDINGS: Evaluation of this exam is limited in the absence of intravenous contrast. Lower chest: There is a 5 mm right lung base subpleural calcified granuloma. The visualized lung bases are otherwise clear. No intra-abdominal free air or free fluid. Hepatobiliary: No focal liver abnormality is seen. No gallstones, gallbladder wall thickening,  or biliary dilatation. Pancreas: Unremarkable. No pancreatic  ductal dilatation or surrounding inflammatory changes. Spleen: Normal in size without focal abnormality. Adrenals/Urinary Tract: The adrenal glands are unremarkable. There is a punctate nonobstructing right renal interpolar calculus. No hydronephrosis. The left kidney is unremarkable. The visualized ureters and urinary bladder appear unremarkable. Stomach/Bowel: There is extensive sigmoid diverticulosis with muscular hypertrophy. There is active inflammatory changes of the sigmoid colon consistent with acute diverticulitis. There is loss of fat plane between the sigmoid colon and dome of the bladder with thickened appearance of the bladder dome consistent with adhesions. An early colovesical fistula is not entirely excluded. However, no gas is identified within the bladder at this time. There is a 1.4 x 1.2 cm complex collection along the inferior aspect of the sigmoid colon in the superior portion of the colovesical adhesion (coronal series 7, image 42). This may represent a diverticular abscess or extension of colonic air and fluid into the partially cannulized colovesical fistula. There is no bowel obstruction. The appendix is normal. Vascular/Lymphatic: The abdominal aorta and IVC are grossly unremarkable on this noncontrast CT. No portal venous gas. There is no adenopathy. Reproductive: The prostate and seminal vesicles are grossly unremarkable. No pelvic mass. Other: Small fat containing umbilical hernia. Musculoskeletal: No acute or significant osseous findings. IMPRESSION: 1. Sigmoid diverticulitis with findings of colovesical adhesion or possible early fistulization. A complex collection along the inferior aspect of the sigmoid colon in the superior portion of the colovesical adhesion may represent a diverticular abscess or extension of colonic air and fluid into the partially cannulizedal fistula. 2. Punctate nonobstructing right renal interpolar  stone. No hydronephrosis. Electronically Signed   By: Elgie Collard M.D.   On: 06/28/2018 02:46    Subjective: No acute issues or events overnight, tolerating p.o. well this afternoon, otherwise stable and agreeable for discharge home   Discharge Exam: Vitals:   06/30/18 0406 06/30/18 1300  BP: 106/77 116/80  Pulse: (!) 55   Resp: 18 18  Temp: 97.6 F (36.4 C) 98.3 F (36.8 C)  SpO2: 100% 100%   Vitals:   06/29/18 1403 06/29/18 1942 06/30/18 0406 06/30/18 1300  BP: 111/71 116/79 106/77 116/80  Pulse: (!) 56 (!) 59 (!) 55   Resp: Temp: 98.1 F (36.7 C) 97.8 F (36.6 C) 97.6 F (36.4 C) 98.3 F (36.8 C)  TempSrc: Oral Oral Oral Oral  SpO2: 100% 100% 100% 100%  Weight:      Height:        General: Pt is alert, awake, not in acute distress Cardiovascular: RRR, S1/S2 +, no rubs, no gallops Respiratory: CTA bilaterally, no wheezing, no rhonchi Abdominal: Soft, NT, ND, bowel sounds + Extremities: no edema, no cyanosis    The results of significant diagnostics from this hospitalization (including imaging, microbiology, ancillary and laboratory) are listed below for reference.     Microbiology: Recent Results (from the past 240 hour(s))  SARS Coronavirus 2 (CEPHEID - Performed in [facility] hospital lab), Hosp Order     Status: None   Collection Time: 06/28/18  4:35 AM  Result Value Ref Range Status   SARS Coronavirus 2 NEGATIVE NEGATIVE Final    Comment: (NOTE) If result is NEGATIVE SARS-CoV-2 target nucleic acids are NOT DETECTED. The SARS-CoV-2 RNA is generally detectable in upper and lower  respiratory specimens during the acute phase of infection. The lowest  concentration of SARS-CoV-2 viral copies this assay can detect is 250  copies / mL. A negative result does not preclude SARS-CoV-2 infection  and should not be used as the sole basis for treatment or other  patient management decisions.  A negative result may occur with  improper specimen  collection / handling, submission of specimen other  than nasopharyngeal swab, presence of viral mutation(s) within the  areas targeted by this assay, and inadequate number of viral copies  (<250 copies / mL). A negative result must be combined with clinical  observations, patient history, and epidemiological information. If result is POSITIVE SARS-CoV-2 target nucleic acids are DETECTED. The SARS-CoV-2 RNA is generally detectable in upper and lower  respiratory specimens dur ing the acute phase of infection.  Positive  results are indicative of active infection with SARS-CoV-2.  Clinical  correlation with patient history and other diagnostic information is  necessary to determine patient infection status.  Positive results do  not rule out bacterial infection or co-infection with other viruses. If result is PRESUMPTIVE POSTIVE SARS-CoV-2 nucleic acids MAY BE PRESENT.   A presumptive positive result was obtained on the submitted specimen  and confirmed on repeat testing.  While 2019 novel coronavirus  (SARS-CoV-2) nucleic acids may be present in the submitted sample  additional confirmatory testing may be necessary for epidemiological  and / or clinical management purposes  to differentiate between  SARS-CoV-2 and other Sarbecovirus currently known to infect humans.  If clinically indicated additional testing with an alternate test  methodology [phone]) is advised. The SARS-CoV-2 RNA is generally  detectable in upper and lower respiratory sp ecimens during the acute  phase of infection. The expected result is Negative. Fact Sheet for Patients:  BoilerBrush.com.cy Fact Sheet for Healthcare Providers: https://pope.com/ This test is not yet approved or cleared by the [place] FDA and has been authorized for detection and/or diagnosis of SARS-CoV-2 by FDA under an Emergency Use Authorization (EUA).  This EUA will remain in effect  (meaning this test can be used) for the duration of the COVID-19 declaration under Section 564(b)(1) of the Act, 21 U.S.C. section 360bbb-3(b)(1), unless the authorization is terminated or revoked sooner. Performed at [facility], [address], [place], [place] [address]      Labs: BNP (last 3 results) No results for input(s): BNP in the last 8760 hours. Basic Metabolic Panel: Recent Labs  Lab 06/27/18 1833 06/29/18 0608 06/30/18 0425  NA 138 139 141  K 4.1 3.6 3.4*  CL 103 108 108  CO2 GLUCOSE 97 122* 70  BUN CREATININE 0.94 0.93 0.93  CALCIUM 9.3 8.3* 8.6*   Liver Function Tests: Recent Labs  Lab 06/27/18 1833 06/29/18 0608 06/30/18 0425  AST 19 14* 15  ALT ALKPHOS 86 66 66  BILITOT 0.8 0.9 0.6  PROT 7.8 5.6* 6.0*  ALBUMIN 3.8 2.7* 3.0*   No results for input(s): LIPASE, AMYLASE in the last 168 hours. No results for input(s): AMMONIA in the last 168 hours. CBC: Recent Labs  Lab 06/27/18 1833 06/29/18 0608 06/30/18 0425  WBC 11.0* 3.5* 4.9  NEUTROABS 7.9* 1.5* 2.2  HGB 15.5 13.9 13.8  HCT 45.7 40.5 40.4  MCV 85.9 84.7 85.2  PLT 324 275 291   Cardiac Enzymes: No results for input(s): CKTOTAL, CKMB, CKMBINDEX, TROPONINI in the last 168 hours. BNP: Invalid input(s): POCBNP CBG: Recent Labs  Lab 06/29/18 0736 06/29/18 1602 06/30/18 0002 06/30/18 0629 06/30/18 1407  GLUCAP 100* 142* 81 65* 109*   D-Dimer No results for input(s): DDIMER in the last 72  hours. Hgb A1c No results for input(s): HGBA1C in the last 72 hours. Lipid Profile No results for input(s): CHOL, HDL, LDLCALC, TRIG, CHOLHDL, LDLDIRECT in the last 72 hours. Thyroid function studies No results for input(s): TSH, T4TOTAL, T3FREE, THYROIDAB in the last 72 hours.  Invalid input(s): FREET3 Anemia work up No results for input(s): VITAMINB12, FOLATE, FERRITIN, TIBC, IRON, RETICCTPCT in the last 72 hours. Urinalysis    Component Value Date/Time    COLORURINE YELLOW 06/28/2018 0245   APPEARANCEUR CLEAR 06/28/2018 0245   LABSPEC 1.013 06/28/2018 0245   PHURINE 6.0 06/28/2018 0245   GLUCOSEU NEGATIVE 06/28/2018 0245   HGBUR NEGATIVE 06/28/2018 0245   BILIRUBINUR NEGATIVE 06/28/2018 0245   KETONESUR 20 (A) 06/28/2018 0245   PROTEINUR NEGATIVE 06/28/2018 0245   NITRITE NEGATIVE 06/28/2018 0245   LEUKOCYTESUR NEGATIVE 06/28/2018 0245   Sepsis Labs Invalid input(s): PROCALCITONIN,  WBC,  LACTICIDVEN Microbiology Recent Results (from the past 240 hour(s))  SARS Coronavirus 2 (CEPHEID - Performed in [facility] hospital lab), Hosp Order     Status: None   Collection Time: 06/28/18  4:35 AM  Result Value Ref Range Status   SARS Coronavirus 2 NEGATIVE NEGATIVE Final    Comment: (NOTE) If result is NEGATIVE SARS-CoV-2 target nucleic acids are NOT DETECTED. The SARS-CoV-2 RNA is generally detectable in upper and lower  respiratory specimens during the acute phase of infection. The lowest  concentration of SARS-CoV-2 viral copies this assay can detect is 250  copies / mL. A negative result does not preclude SARS-CoV-2 infection  and should not be used as the sole basis for treatment or other  patient management decisions.  A negative result may occur with  improper specimen collection / handling, submission of specimen other  than nasopharyngeal swab, presence of viral mutation(s) within the  areas targeted by this assay, and inadequate number of viral copies  (<250 copies / mL). A negative result must be combined with clinical  observations, patient history, and epidemiological information. If result is POSITIVE SARS-CoV-2 target nucleic acids are DETECTED. The SARS-CoV-2 RNA is generally detectable in upper and lower  respiratory specimens dur ing the acute phase of infection.  Positive  results are indicative of active infection with SARS-CoV-2.  Clinical  correlation with patient history and other diagnostic information is   necessary to determine patient infection status.  Positive results do  not rule out bacterial infection or co-infection with other viruses. If result is PRESUMPTIVE POSTIVE SARS-CoV-2 nucleic acids MAY BE PRESENT.   A presumptive positive result was obtained on the submitted specimen  and confirmed on repeat testing.  While 2019 novel coronavirus  (SARS-CoV-2) nucleic acids may be present in the submitted sample  additional confirmatory testing may be necessary for epidemiological  and / or clinical management purposes  to differentiate between  SARS-CoV-2 and other Sarbecovirus currently known to infect humans.  If clinically indicated additional testing with an alternate test  methodology [phone]([id number]) is advised. The SARS-CoV-2 RNA is generally  detectable in upper and lower respiratory sp ecimens during the acute  phase of infection. The expected result is Negative. Fact Sheet for Patients:  BoilerBrush.com.cyhttps://www.fda.gov/media/136312/download Fact Sheet for Healthcare Providers: https://pope.com/https://www.fda.gov/media/136313/download This test is not yet approved or cleared by the [place] FDA and has been authorized for detection and/or diagnosis of SARS-CoV-2 by FDA under an Emergency Use Authorization (EUA).  This EUA will remain in effect (meaning this test can be used) for the duration of the COVID-19 declaration under Section 564(b)(1)  of the Act, 21 U.S.C. section 360bbb-3(b)(1), unless the authorization is terminated or revoked sooner. Performed at [facility], [address], [place], [place] [address]      Time coordinating discharge: Over 30 minutes  SIGNED:  Azucena Fallen, DO [facility] 06/30/2018, 3:00 PM Pager

## 2018-07-13 ENCOUNTER — Encounter: Payer: Self-pay | Admitting: *Deleted

## 2018-07-18 ENCOUNTER — Other Ambulatory Visit
Admission: RE | Admit: 2018-07-18 | Discharge: 2018-07-18 | Disposition: A | Discharge status: Home / Self Care | Payer: BC Managed Care – PPO | Source: Ambulatory Visit | Attending: Gastroenterology | Admitting: Gastroenterology

## 2018-07-18 DIAGNOSIS — R197 Diarrhea, unspecified: Secondary | ICD-10-CM | POA: Insufficient documentation

## 2018-07-18 LAB — GASTROINTESTINAL PANEL BY PCR, STOOL (REPLACES STOOL CULTURE)

## 2018-07-18 LAB — C DIFFICILE QUICK SCREEN W PCR REFLEX
C Diff antigen: POSITIVE — AB
C Diff interpretation: DETECTED
C Diff toxin: POSITIVE — AB

## 2018-07-20 ENCOUNTER — Ambulatory Visit: Payer: BC Managed Care – PPO | Admitting: Surgery

## 2018-07-20 ENCOUNTER — Other Ambulatory Visit: Payer: Self-pay

## 2018-07-20 ENCOUNTER — Encounter: Payer: Self-pay | Admitting: Surgery

## 2018-07-20 VITALS — BP 127/88 | HR 62 | Temp 97.7°F | Ht 60.0 in | Wt 143.0 lb

## 2018-07-20 DIAGNOSIS — A0472 Enterocolitis due to Clostridium difficile, not specified as recurrent: Secondary | ICD-10-CM | POA: Diagnosis not present

## 2018-07-20 DIAGNOSIS — K5720 Diverticulitis of large intestine with perforation and abscess without bleeding: Secondary | ICD-10-CM | POA: Diagnosis not present

## 2018-07-20 NOTE — Progress Notes (Signed)
Surgical Clinic Progress/Follow-up Note   HPI:  48 y.o. Male presents to clinic for follow-up evaluation. Patient was admitted to [facility] (04/15/2018) for recurrence of acute sigmoid colonic diverticulitis despite no history of constipation, after which he was instructed to complete a course of oral antibiotics and follow up for colonoscopy and laparoscope-assisted sigmoid colectomy. However, once feeling better, patient elected to not follow up, after which he again experienced LLQ abdominal pain, for which he presented to [facility] (06/28/2018) and was again diagnosed with uncomplicated acute sigmoid colonic diverticulitis. He was again encouraged to undergo colonoscopy + subsequent partial colectomy and follow up accordingly today. He recently was seen by Dr. Horace Porteousoledo's office, at which he complained of profuse watery diarrhea and was diagnosed with c difficile, for which patient has been taking oral vancomycin x 2 days and has many questions. Patient otherwise reports his watery diarrhea has improved and become somewhat frequent soft formed BM's without abdominal pain or distention, N/V, fever/chills, dizziness, CP, or SOB. He reports he's been keeping hydrated.  Review of Systems:  Constitutional: denies any other weight loss, fever, chills, or sweats  Eyes: denies any other vision changes, history of eye injury  ENT: denies sore throat, hearing problems  Respiratory: denies shortness of breath, wheezing  Cardiovascular: denies chest pain, palpitations  Gastrointestinal: abdominal pain, N/V, and bowel function as per HPI Musculoskeletal: denies any other joint pains or cramps  Skin: Denies any other rashes or skin discolorations  Neurological: denies any other headache, dizziness, weakness  Psychiatric: denies any other depression, anxiety  All other review of systems: otherwise negative   Vital Signs:  BP 127/88   Pulse 62   Temp 97.7 F (36.5 C) (Temporal)   Ht 5' (1.524 m)   Wt 143 lb (64.9 kg)    SpO2 98%   BMI 27.93 kg/m    Physical Exam:  Constitutional:  -- Normal body habitus  -- Awake, alert, and oriented x3  Eyes:  -- Pupils equally round and reactive to light  -- No scleral icterus  Ear, nose, throat:  -- No jugular venous distension  -- No nasal drainage, bleeding Pulmonary:  -- No crackles -- Equal breath sounds bilaterally -- Breathing non-labored at rest Cardiovascular:  -- S1, S2 present  -- No pericardial rubs  Gastrointestinal:  -- Soft and non-distended with minimal LLQ and suprapubic tenderness to deep palpation, no guarding/rebound  -- No abdominal masses appreciated, pulsatile or otherwise  Musculoskeletal / Integumentary:  -- Wounds or skin discoloration: None appreciated  -- Extremities: B/L UE and LE FROM, hands and feet warm, no edema  Neurologic:  -- Motor function: intact and symmetric  -- Sensation: intact and symmetric   Laboratory studies:  CBC Latest Ref Rng & Units 06/30/2018 06/29/2018 06/27/2018  WBC 4.0 - 10.5 K/uL 4.9 3.5(L) 11.0(H)  Hemoglobin 13.0 - 17.0 g/dL 16.113.8 09.613.9 04.515.5  Hematocrit 39.0 - 52.0 % 40.4 40.5 45.7  Platelets 150 - 400 K/uL 291 275 324   CMP Latest Ref Rng & Units 06/30/2018 06/29/2018 06/27/2018  Glucose 70 - 99 mg/dL 70 409(W122(H) 97  BUN 6 - 20 mg/dL 6 6 16   Creatinine 0.61 - 1.24 mg/dL 1.190.93 1.470.93 8.290.94  Sodium 135 - 145 mmol/L 141 139 138  Potassium 3.5 - 5.1 mmol/L 3.4(L) 3.6 4.1  Chloride 98 - 111 mmol/L 108 108 103  CO2 22 - 32 mmol/L 23 22 26   Calcium 8.9 - 10.3 mg/dL 5.6(O8.6(L) 8.3(L) 9.3  Total Protein 6.5 - 8.1 g/dL  6.0(L) 5.6(L) 7.8  Total Bilirubin 0.3 - 1.2 mg/dL 0.6 0.9 0.8  Alkaline Phos 38 - 126 U/L 66 66 86  AST 15 - 41 U/L 15 14(L) 19  ALT 0 - 44 U/L 14 14 17    Imaging: No new pertinent imaging studies available for review at this time   Assessment:  48 y.o. yo Male with a problem list including...  Patient Active Problem List   Diagnosis Date Noted  . Diverticulitis 06/28/2018  . Diverticulitis of  large intestine with abscess without bleeding 04/15/2018    presents to clinic for follow-up evaluation, doing overall well despite multiply and short-interval recurrent acute sigmoid colonic diverticulitis without modifiable risk factors (constipation), complicated by improving symptomatic clostridium difficile colitis on oral vancomycin.  Plan:   - natural history of clostridium difficile was discussed with patient and his daughter   - hand washing with soap and water, cleaning surfaces with bleach, and hydration were discussed and encouraged  - if not continuing to improve from clostridium difficile perspective, anticipate addition of metronidazole  - patient is scheduled to follow up with GI in 2 weeks with colonoscopy the first week of August ([place], 8/5)  - patient advised to call our office or GI if not continuing to improve (diarrhea, pain, etc)  - return to clinic in 6 weeks (following colonoscopy) to discuss sigmoid colectomy  - instructed to call office if any questions or concerns  All of the above recommendations were discussed with the patient and patient's daughter with the assistance of in-person certified medical Spanish-English translation services, and all of patient's and family's questions were answered to their expressed satisfaction.  -- Marilynne Drivers Rosana Hoes, MD, [place]: [facility] - Partnering for exceptional care. Office: [phone]

## 2018-07-20 NOTE — Patient Instructions (Addendum)
Please continue taking the Antibiotics until gone.   Please see your appointment listed below.        Diverticulitis  Diverticulitis is when small pockets in your large intestine (colon) get infected or swollen. This causes stomach pain and watery poop (diarrhea). These pouches are called diverticula. They form in people who have a condition called diverticulosis. Follow these instructions at home: Medicines  Take over-the-counter and prescription medicines only as told by your doctor. These include: ? Antibiotics. ? Pain medicines. ? Fiber pills. ? Probiotics. ? Stool softeners.  Do not drive or use heavy machinery while taking prescription pain medicine.  If you were prescribed an antibiotic, take it as told. Do not stop taking it even if you feel better. General instructions   Follow a diet as told by your doctor.  When you feel better, your doctor may tell you to change your diet. You may need to eat a lot of fiber. Fiber makes it easier to poop (have bowel movements). Healthy foods with fiber include: ? Berries. ? Beans. ? Lentils. ? Green vegetables.  Exercise 3 or more times a week. Aim for 30 minutes each time. Exercise enough to sweat and make your heart beat faster.  Keep all follow-up visits as told. This is important. You may need to have an exam of the large intestine. This is called a colonoscopy. Contact a doctor if:  Your pain does not get better.  You have a hard time eating or drinking.  You are not pooping like normal. Get help right away if:  Your pain gets worse.  Your problems do not get better.  Your problems get worse very fast.  You have a fever.  You throw up (vomit) more than one time.  You have poop that is: ? Bloody. ? Black. ? Tarry. Summary  Diverticulitis is when small pockets in your large intestine (colon) get infected or swollen.  Take medicines only as told by your doctor.  Follow a diet as told by your doctor.  This information is not intended to replace advice given to you by your health care provider. Make sure you discuss any questions you have with your health care provider. Document Released: 06/30/2007 Document Revised: 01/29/2016 Document Reviewed: 01/29/2016 Elsevier Interactive Patient Education  2019 [employer].

## 2018-08-10 ENCOUNTER — Ambulatory Visit: Payer: BC Managed Care – PPO | Admitting: Gastroenterology

## 2018-08-16 ENCOUNTER — Other Ambulatory Visit
Admission: RE | Admit: 2018-08-16 | Discharge: 2018-08-16 | Disposition: A | Discharge status: Home / Self Care | Payer: BC Managed Care – PPO | Source: Ambulatory Visit | Attending: Gastroenterology | Admitting: Gastroenterology

## 2018-08-16 DIAGNOSIS — R197 Diarrhea, unspecified: Secondary | ICD-10-CM | POA: Diagnosis present

## 2018-08-16 LAB — GASTROINTESTINAL PANEL BY PCR, STOOL (REPLACES STOOL CULTURE)

## 2018-08-16 LAB — C DIFFICILE QUICK SCREEN W PCR REFLEX
C Diff antigen: POSITIVE — AB
C Diff interpretation: DETECTED
C Diff toxin: POSITIVE — AB

## 2018-08-25 ENCOUNTER — Other Ambulatory Visit: Payer: BC Managed Care – PPO | Attending: Gastroenterology

## 2018-08-30 ENCOUNTER — Encounter: Admission: RE | Payer: Self-pay | Source: Home / Self Care

## 2018-08-30 ENCOUNTER — Ambulatory Visit
Admission: RE | Admit: 2018-08-30 | Payer: BC Managed Care – PPO | Source: Home / Self Care | Admitting: Gastroenterology

## 2018-08-30 SURGERY — COLONOSCOPY WITH PROPOFOL
Anesthesia: General

## 2018-09-07 ENCOUNTER — Other Ambulatory Visit
Admission: RE | Admit: 2018-09-07 | Discharge: 2018-09-07 | Disposition: A | Discharge status: Home / Self Care | Payer: BC Managed Care – PPO | Source: Ambulatory Visit | Attending: Gastroenterology | Admitting: Gastroenterology

## 2018-09-07 ENCOUNTER — Ambulatory Visit: Payer: Self-pay | Admitting: Surgery

## 2018-09-07 ENCOUNTER — Other Ambulatory Visit: Payer: Self-pay | Admitting: Gastroenterology

## 2018-09-07 DIAGNOSIS — R1032 Left lower quadrant pain: Secondary | ICD-10-CM

## 2018-09-07 DIAGNOSIS — R197 Diarrhea, unspecified: Secondary | ICD-10-CM

## 2018-09-07 DIAGNOSIS — Z8619 Personal history of other infectious and parasitic diseases: Secondary | ICD-10-CM

## 2018-09-07 LAB — GASTROINTESTINAL PANEL BY PCR, STOOL (REPLACES STOOL CULTURE)

## 2018-09-07 LAB — C DIFFICILE QUICK SCREEN W PCR REFLEX
C Diff antigen: NEGATIVE
C Diff interpretation: NOT DETECTED
C Diff toxin: NEGATIVE

## 2018-11-14 ENCOUNTER — Encounter: Payer: Self-pay | Admitting: *Deleted

## 2019-04-03 ENCOUNTER — Other Ambulatory Visit
Admission: RE | Admit: 2019-04-03 | Discharge: 2019-04-03 | Disposition: A | Discharge status: Home / Self Care | Payer: BC Managed Care – PPO | Source: Ambulatory Visit | Attending: Internal Medicine | Admitting: Internal Medicine

## 2019-04-03 DIAGNOSIS — Z01812 Encounter for preprocedural laboratory examination: Secondary | ICD-10-CM | POA: Insufficient documentation

## 2019-04-03 DIAGNOSIS — Z20822 Contact with and (suspected) exposure to covid-19: Secondary | ICD-10-CM | POA: Diagnosis not present

## 2019-04-03 LAB — SARS CORONAVIRUS 2 (TAT 6-24 HRS): SARS Coronavirus 2: NEGATIVE

## 2019-04-04 ENCOUNTER — Encounter: Payer: Self-pay | Admitting: Internal Medicine

## 2019-04-05 ENCOUNTER — Encounter
Admission: RE | Disposition: A | Discharge status: Home / Self Care | Payer: Self-pay | Source: Home / Self Care | Attending: Internal Medicine

## 2019-04-05 ENCOUNTER — Encounter: Payer: Self-pay | Admitting: Internal Medicine

## 2019-04-05 ENCOUNTER — Ambulatory Visit: Payer: BC Managed Care – PPO | Admitting: Anesthesiology

## 2019-04-05 ENCOUNTER — Ambulatory Visit
Admission: RE | Admit: 2019-04-05 | Discharge: 2019-04-05 | Disposition: A | Discharge status: Home / Self Care | Payer: BC Managed Care – PPO | Attending: Internal Medicine | Admitting: Internal Medicine

## 2019-04-05 DIAGNOSIS — Z79899 Other long term (current) drug therapy: Secondary | ICD-10-CM | POA: Diagnosis not present

## 2019-04-05 DIAGNOSIS — R1032 Left lower quadrant pain: Secondary | ICD-10-CM | POA: Insufficient documentation

## 2019-04-05 DIAGNOSIS — K635 Polyp of colon: Secondary | ICD-10-CM | POA: Insufficient documentation

## 2019-04-05 DIAGNOSIS — K5730 Diverticulosis of large intestine without perforation or abscess without bleeding: Secondary | ICD-10-CM | POA: Insufficient documentation

## 2019-04-05 DIAGNOSIS — K640 First degree hemorrhoids: Secondary | ICD-10-CM | POA: Insufficient documentation

## 2019-04-05 HISTORY — PX: COLONOSCOPY WITH PROPOFOL: SHX5780

## 2019-04-05 SURGERY — COLONOSCOPY WITH PROPOFOL
Anesthesia: General

## 2019-04-05 MED ORDER — FENTANYL CITRATE (PF) 100 MCG/2ML IJ SOLN
INTRAMUSCULAR | Status: AC
  Filled 2019-04-05: qty 2

## 2019-04-05 MED ORDER — PROPOFOL 500 MG/50ML IV EMUL
INTRAVENOUS | Status: DC | PRN
  Administered 2019-04-05: 150 ug/kg/min via INTRAVENOUS

## 2019-04-05 MED ORDER — FENTANYL CITRATE (PF) 100 MCG/2ML IJ SOLN
INTRAMUSCULAR | Status: DC | PRN
  Administered 2019-04-05: 50 ug via INTRAVENOUS

## 2019-04-05 MED ORDER — PROPOFOL 500 MG/50ML IV EMUL
INTRAVENOUS | Status: AC
  Filled 2019-04-05: qty 50

## 2019-04-05 MED ORDER — PROPOFOL 10 MG/ML IV BOLUS
INTRAVENOUS | Status: DC | PRN
  Administered 2019-04-05: 70 mg via INTRAVENOUS
  Administered 2019-04-05: 20 mg via INTRAVENOUS
  Administered 2019-04-05: 30 mg via INTRAVENOUS

## 2019-04-05 MED ORDER — LIDOCAINE HCL (PF) 2 % IJ SOLN
INTRAMUSCULAR | Status: AC
  Filled 2019-04-05: qty 5

## 2019-04-05 MED ORDER — SODIUM CHLORIDE 0.9 % IV SOLN: INTRAVENOUS | Status: DC

## 2019-04-05 MED ORDER — LIDOCAINE HCL (CARDIAC) PF 100 MG/5ML IV SOSY
PREFILLED_SYRINGE | INTRAVENOUS | Status: DC | PRN
  Administered 2019-04-05: 50 mg via INTRAVENOUS

## 2019-04-05 NOTE — H&P (Signed)
Outpatient short stay form Pre-procedure 04/05/2019 10:00 AM Sohrab Keelan K. Norma Fredrickson, M.D.  Primary Physician: N/A  Reason for visit:  Hx diverticulitis, LLQ pain, hx of diarrhea/C diff  History of present illness: as above. Patient denies change in bowel habits, rectal bleeding, weight loss or abdominal pain.      Current Facility-Administered Medications:  .  0.9 %  sodium chloride infusion, , Intravenous, Continuous, [place], Boykin Nearing, MD, Last Rate: 20 mL/hr at 04/05/19 0905, New Bag at 04/05/19 0905  Medications Prior to Admission  Medication Sig Dispense Refill Last Dose  . dicyclomine (BENTYL) 10 MG capsule Take by mouth.        No Known Allergies   Past Medical History:  Diagnosis Date  . Diverticulitis 06/2018    Review of systems:  Otherwise negative.    Physical Exam  Gen: Alert, oriented. Appears stated age.  HEENT: [place]/AT. PERRLA. Lungs: CTA, no wheezes. CV: RR nl S1, S2. Abd: soft, benign, no masses. BS+ Ext: No edema. Pulses 2+    Planned procedures: Proceed with colonoscopy. The patient understands the nature of the planned procedure, indications, risks, alternatives and potential complications including but not limited to bleeding, infection, perforation, damage to internal organs and possible oversedation/side effects from anesthesia. The patient agrees and gives consent to proceed.  Please refer to procedure notes for findings, recommendations and patient disposition/instructions.     Cara Thaxton K. Norma Fredrickson, M.D. Gastroenterology 04/05/2019  10:00 AM

## 2019-04-05 NOTE — Op Note (Addendum)
[facility] Gastroenterology Patient Name: Fillmore Bynum Procedure Date: 04/05/2019 10:15 AM MRN: [id number] Account #: [id number] Date of Birth: Jun 15, 1970 Admit Type: Outpatient Age: 49 Room: [facility] ENDO ROOM 1 Gender: Male Note Status: Supervisor Override Procedure:             Colonoscopy Indications:           Abdominal pain in the left lower quadrant,                         Hematochezia, Abnormal CT of the GI tract, Follow-up                         of diverticulitis Providers:             Boykin Nearing. Norma Fredrickson MD, MD Referring MD:          No Local Md, MD (Referring MD) Medicines:             Propofol per Anesthesia Complications:         No immediate complications. Estimated blood loss:                         Minimal. Procedure:             Pre-Anesthesia Assessment:                        - The risks and benefits of the procedure and the                         sedation options and risks were discussed with the                         patient. All questions were answered and informed                         consent was obtained.                        - Patient identification and proposed procedure were                         verified prior to the procedure by the nurse. The                         procedure was verified in the procedure room.                        - ASA Grade Assessment: II - A patient with mild                         systemic disease.                        - After reviewing the risks and benefits, the patient                         was deemed in satisfactory condition to undergo the  procedure.                        After obtaining informed consent, the colonoscope was                         passed under direct vision. Throughout the procedure,                         the patient's blood pressure, pulse, and oxygen                         saturations were monitored continuously. The                          Colonoscope was introduced through the anus and                         advanced to the the cecum, identified by appendiceal                         orifice and ileocecal valve. The colonoscopy was                         performed without difficulty. The patient tolerated                         the procedure well. The quality of the bowel                         preparation was good. The ileocecal valve, appendiceal                         orifice, and rectum were photographed. Findings:      The perianal and digital rectal examinations were normal. Pertinent       negatives include normal sphincter tone and no palpable rectal lesions.      Many small-mouthed diverticula were found in the sigmoid colon.      Non-bleeding internal hemorrhoids were found during retroflexion. The       hemorrhoids were Grade I (internal hemorrhoids that do not prolapse).      A polypoid non-obstructing small mass was found in the distal sigmoid       colon. The mass was non-circumferential. The mass measured four cm in       length. In addition, its diameter measured twenty-six mm. No bleeding       was present. Area was tattooed with an injection of spot carbon black       4cc (1cc each quadrant) approximately 5cm proximal and distal to the       lesion. total 8cc injected, 9cc total used. Estimated blood loss was       minimal. The polyp was removed with a lift and cut technique using a hot       snare. Polyp resection was incomplete. The resected tissue was retrieved.      The exam was otherwise without abnormality. Impression:            - Diverticulosis in the sigmoid colon.                        -  Non-bleeding internal hemorrhoids.                        - Rule out malignancy, tumor in the distal sigmoid                         colon. Large hot snare resection pieces taken,                         incomplete. Tattooed.                        - The examination was otherwise  normal. Recommendation:        - Patient has a contact number available for                         emergencies. The signs and symptoms of potential                         delayed complications were discussed with the patient.                         Return to normal activities tomorrow. Written                         discharge instructions were provided to the patient.                        - Resume previous diet.                        - Continue present medications.                        - Await pathology results.                        - Refer to a surgeon at appointment to be scheduled.                        - The findings and recommendations were discussed with                         the patient.                        - Return to GI office PRN. Procedure Code(s):     --- Professional ---                        [phone], Colonoscopy, flexible; with removal of                         tumor(s), polyp(s), or other lesion(s) by snare                         technique                        [id number], Colonoscopy, flexible; with directed submucosal  injection(s), any substance Diagnosis Code(s):     --- Professional ---                        R93.3, Abnormal findings on diagnostic imaging of                         other parts of digestive tract                        K57.30, Diverticulosis of large intestine without                         perforation or abscess without bleeding                        K57.32, Diverticulitis of large intestine without                         perforation or abscess without bleeding                        K92.1, Melena (includes Hematochezia)                        R10.32, Left lower quadrant pain                        D49.0, Neoplasm of unspecified behavior of digestive                         system                        K64.0, First degree hemorrhoids CPT copyright 2019 American Medical Association. All rights reserved. The  codes documented in this report are preliminary and upon coder review may  be revised to meet current compliance requirements. Efrain Sella MD, MD 04/05/2019 11:10:45 AM This report has been signed electronically. Number of Addenda: 0 Note Initiated On: 04/05/2019 10:15 AM Scope Withdrawal Time: 0 hours 34 minutes 57 seconds  Total Procedure Duration: 0 hours 37 minutes 11 seconds  Estimated Blood Loss:  Estimated blood loss was minimal. Estimated blood loss                         was minimal.      [facility]

## 2019-04-05 NOTE — Anesthesia Postprocedure Evaluation (Signed)
Anesthesia Post Note  Patient: Jeffrey Charles  Procedure(s) Performed: COLONOSCOPY WITH PROPOFOL (N/A )  Patient location during evaluation: PACU Anesthesia Type: General Level of consciousness: awake and alert Pain management: pain level controlled Vital Signs Assessment: post-procedure vital signs reviewed and stable Respiratory status: spontaneous breathing, nonlabored ventilation and respiratory function stable Cardiovascular status: blood pressure returned to baseline and stable Postop Assessment: no apparent nausea or vomiting Anesthetic complications: no     Last Vitals:  Vitals:   04/05/19 1115 04/05/19 1125  BP: 99/68 114/87  Pulse: (!) 58 60  Resp: 11 15  Temp:    SpO2: 98% 98%    Last Pain:  Vitals:   04/05/19 1125  TempSrc:   PainSc: 0-No pain                 Karleen Hampshire

## 2019-04-05 NOTE — Anesthesia Preprocedure Evaluation (Addendum)
Anesthesia Evaluation  Patient identified by MRN, date of birth, ID band Patient awake    Reviewed: Allergy & Precautions, H&P , NPO status , Patient's Chart, lab work & pertinent test results  Airway Mallampati: II  TM Distance: >3 FB Neck ROM: full    Dental  (+) Missing   Pulmonary neg pulmonary ROS, neg COPD,           Cardiovascular (-) angina(-) Past MI negative cardio ROS  (-) dysrhythmias      Neuro/Psych negative neurological ROS  negative psych ROS   GI/Hepatic negative GI ROS, Neg liver ROS,   Endo/Other  negative endocrine ROS  Renal/GU negative Renal ROS  negative genitourinary   Musculoskeletal   Abdominal   Peds  Hematology negative hematology ROS (+)   Anesthesia Other Findings Past Medical History: 06/2018: Diverticulitis  Past Surgical History: No date: NO PAST SURGERIES     Reproductive/Obstetrics negative OB ROS                            Anesthesia Physical Anesthesia Plan  ASA: II  Anesthesia Plan: General   Post-op Pain Management:    Induction:   PONV Risk Score and Plan: Propofol infusion and TIVA  Airway Management Planned: Natural Airway and Nasal Cannula  Additional Equipment:   Intra-op Plan:   Post-operative Plan:   Informed Consent: I have reviewed the patients History and Physical, chart, labs and discussed the procedure including the risks, benefits and alternatives for the proposed anesthesia with the patient or authorized representative who has indicated his/her understanding and acceptance.     Dental Advisory Given  Plan Discussed with: Anesthesiologist  Anesthesia Plan Comments:        Anesthesia Quick Evaluation

## 2019-04-05 NOTE — Interval H&P Note (Signed)
History and Physical Interval Note:  04/05/2019 10:01 AM  Jeffrey Charles  has presented today for surgery, with the diagnosis of P HX DIVERTICULITIS.  The various methods of treatment have been discussed with the patient and family. After consideration of risks, benefits and other options for treatment, the patient has consented to  Procedure(s): COLONOSCOPY WITH PROPOFOL (N/A) as a surgical intervention.  The patient's history has been reviewed, patient examined, no change in status, stable for surgery.  I have reviewed the patient's chart and labs.  Questions were answered to the patient's satisfaction.     [place], [place]

## 2019-04-05 NOTE — Transfer of Care (Signed)
Immediate Anesthesia Transfer of Care Note  Patient: Jeffrey Charles  Procedure(s) Performed: COLONOSCOPY WITH PROPOFOL (N/A )  Patient Location: PACU  Anesthesia Type:General  Level of Consciousness: sedated  Airway & Oxygen Therapy: Patient Spontanous Breathing  Post-op Assessment: Report given to RN and Post -op Vital signs reviewed and stable  Post vital signs: Reviewed and stable  Last Vitals:  Vitals Value Taken Time  BP 101/67 04/05/19 1105  Temp 36.4 C 04/05/19 1105  Pulse 64 04/05/19 1107  Resp 12 04/05/19 1107  SpO2 95 % 04/05/19 1107  Vitals shown include unvalidated device data.  Last Pain:  Vitals:   04/05/19 1105  TempSrc:   PainSc: Asleep         Complications: No apparent anesthesia complications

## 2019-04-06 ENCOUNTER — Encounter: Payer: Self-pay | Admitting: *Deleted

## 2019-04-06 LAB — SURGICAL PATHOLOGY

## 2019-04-11 ENCOUNTER — Ambulatory Visit: Payer: Self-pay | Admitting: General Surgery

## 2019-04-11 NOTE — H&P (Signed)
PATIENT PROFILE: Jeffrey Charles is a 49 y.o. male who presents to the Clinic for consultation at the request of Dr. Alice Reichert for evaluation of sigmoid polyp.  PCP:  [facility]  HISTORY OF PRESENT ILLNESS: [name] reports he had a colonoscopy last week and was found with a large sigmoid polyp.  The patient reported that he had a colonoscopy because he had 2 episode of diverticulitis and C. difficile during the last year.  He has been having constant left lower quadrant pain.  He was found with 4 x 3 cm distal sigmoid polyp.  Patient reports few episode of lower GI bleeding.  He denies weight loss.  He denies any family history of colon cancer.  There is mild pain in the left lower quadrant.  The pain does not radiate to other part of the body.  There is no alleviating or aggravating factor.  He denies diarrhea or constipation.  Of note is, the patient had 2 episode of complicated diverticulitis with abscess during 2020.  Both of them were treated with IV antibiotic therapy the abscesses were not amenable for drainage.  Patient has recovered well from this episode.  He has been following healthy diet since last episode.  PROBLEM LIST:         Problem List  Never Reviewed         Noted   Prediabetes 07/14/2018   Preventative health care 07/14/2018   Abdominal pain, LLQ (left lower quadrant) 07/14/2018   Tobacco use 07/14/2018   Well adult exam 07/14/2018   Health care maintenance 07/14/2018   Screening for STD (sexually transmitted disease) 07/14/2018   Allergic rhinitis 07/14/2018   History of diverticulitis 07/14/2018   Diverticulitis 06/28/2018   Diverticulitis of large intestine with abscess without bleeding 04/15/2018      GENERAL REVIEW OF SYSTEMS:   General ROS: negative for - chills, fatigue, fever, weight gain or weight loss Allergy and Immunology ROS: negative for - hives  Hematological and Lymphatic ROS: negative for - bleeding  problems or bruising, negative for palpable nodes Endocrine ROS: negative for - heat or cold intolerance, hair changes Respiratory ROS: negative for - cough, shortness of breath or wheezing Cardiovascular ROS: no chest pain or palpitations GI ROS: negative for nausea, vomiting, diarrhea, constipation.  Positive for abdominal pain Musculoskeletal ROS: negative for - joint swelling or muscle pain Neurological ROS: negative for - confusion, syncope Dermatological ROS: negative for pruritus and rash Psychiatric: negative for anxiety, depression, difficulty sleeping and memory loss  MEDICATIONS: Current Medications        Current Outpatient Medications  Medication Sig Dispense Refill  . metroNIDAZOLE (FLAGYL) 500 MG tablet Take 2 tablets at 2 pm, 3 pm and 10 pm the day before surgery. 6 tablet 0  . neomycin 500 mg tablet Take 2 tablets at 2 pm, 3 pm and 10 pm the day before surgery. 6 tablet 0   No current facility-administered medications for this visit.       ALLERGIES: Patient has no known allergies.  PAST MEDICAL HISTORY:     Past Medical History:  Diagnosis Date  . Abdominal pain, LLQ (left lower quadrant) 07/14/2018  . Diverticulitis 06/28/2018  . Diverticulitis of large intestine with abscess without bleeding 04/15/2018  . History of diverticulitis 07/14/2018  . Prediabetes 07/14/2018  . Preventative health care 07/14/2018    PAST SURGICAL HISTORY: History reviewed. No pertinent surgical history.   FAMILY HISTORY: Family History  Problem Relation Age of Onset  .  Diverticulitis Other   . Other Maternal Uncle        intestinal disease     SOCIAL HISTORY: Social History          Socioeconomic History  . Marital status: Single    Spouse name: Not on file  . Number of children: Not on file  . Years of education: Not on file  . Highest education level: Not on file  Occupational History  . Not on file  Social Needs  . Financial resource strain: Not on  file  . Food insecurity    Worry: Not on file    Inability: Not on file  . Transportation needs    Medical: Not on file    Non-medical: Not on file  Tobacco Use  . Smoking status: Never Smoker  . Smokeless tobacco: Never Used  Substance and Sexual Activity  . Alcohol use: Not Currently    Binge frequency: Never  . Drug use: Not Currently  . Sexual activity: Not on file  Other Topics Concern  . Not on file  Social History Narrative  . Not on file      PHYSICAL EXAM:    Vitals:   04/09/19 1427  BP: 138/87  Pulse: 60   Body mass index is 26.57 kg/m. Weight: 68 kg (150 lb)   GENERAL: Alert, active, oriented x3  HEENT: Pupils equal reactive to light. Extraocular movements are intact. Sclera clear. Palpebral conjunctiva normal red color.Pharynx clear.  NECK: Supple with no palpable mass and no adenopathy.  LUNGS: Sound clear with no rales rhonchi or wheezes.  HEART: Regular rhythm S1 and S2 without murmur.  ABDOMEN: Soft and depressible, nontender with no palpable mass, no hepatomegaly.   EXTREMITIES: Well-developed well-nourished symmetrical with no dependent edema.  NEUROLOGICAL: Awake alert oriented, facial expression symmetrical, moving all extremities.  REVIEW OF DATA: I have reviewed the following data today:      Initial consult on 04/09/2019  Component Date Value  . WBC (White Blood Cell Co* 04/09/2019 5.8   . RBC (Red Blood Cell Coun* 04/09/2019 5.25   . Hemoglobin 04/09/2019 15.2   . Hematocrit 04/09/2019 44.2   . MCV (Mean Corpuscular Vo* 04/09/2019 84.2   . MCH (Mean Corpuscular He* 04/09/2019 29.0   . MCHC (Mean Corpuscular H* 04/09/2019 34.4   . Platelet Count 04/09/2019 276   . RDW-CV (Red Cell Distrib* 04/09/2019 12.0   . MPV (Mean Platelet Volum* 04/09/2019 8.8*  . Neutrophils 04/09/2019 2.50   . Lymphocytes 04/09/2019 2.80   . Monocytes 04/09/2019 0.36   . Eosinophils 04/09/2019 0.12   . Basophils 04/09/2019 0.03    . Neutrophil % 04/09/2019 42.9   . Lymphocyte % 04/09/2019 48.1   . Monocyte % 04/09/2019 6.2   . Eosinophil % 04/09/2019 2.1   . Basophil% 04/09/2019 0.5   . Immature Granulocyte % 04/09/2019 0.2   . Immature Granulocyte Cou* 04/09/2019 0.01      ASSESSMENT: [name] is a 48 y.o. male presenting for consultation for large distal sigmoid polyp.  Patient was found with a large 4 x 3 cm sigmoid polyp.  Final pathology from sigmoid polyp biopsy count inflammatory polyp without atypia or malignancy.  There is no sign of adenoma.  Patient was oriented about these findings.  There is no sign of malignancy.  There is no increased risk of malignancy from this polyp.  This polyp was bloody from previous diverticulitis episode.  Patient was oriented that due to his multiple episode   of complicated diverticulitis with abscess and the concern of significant inflammation close to the bladder, surgical management for diverticulitis was discussed.  Recommendation of having partial colectomy with anastomosis and elective basis was discussed with the patient.  The goal of this management is to decrease the risk of having subsequent perforated diverticulitis.  I had a long discussion with the patient and the daughter about the surgical management that includes minimally invasive partial colectomy with anastomosis.  Risk of the patient the low risk of needing a colostomy.  Discussed with the patient the risk of surgery that includes bleeding, infection, injury to ureter, injury to bladder, need of more complicated repairs.  Also discussed with the patient the risk of anastomosis leak, abscess, obstruction and subsequent need of further surgery for repair 1 of these complications.  Also discussed with the patient the risk of anesthesia that includes pneumonia, DVT, cardiac complications, among others.  The patient and daughter report they understood and agreed to proceed with surgery.  Diverticulitis of  large intestine with abscess without bleeding [K57.20]  PLAN: 1. Robotic assisted laparoscopic Sigmoid colectomy with anastomosis (60109, [id number]) 2. CBC, CMP 3. Bowel prep the day before surgery 4. Will order pre op antibiotics to take at home the day before surgery 5. Contact us if has any question or concern.   Patient verbalized understanding, all questions were answered, and were agreeable with the plan outlined above.   I spent a total of 60 minutes in both face-to-face and non-face-to-face activities for this visit on the date of this encounter.   Carolan Shiver, MD  Electronically signed by Carolan Shiver, MD

## 2019-04-11 NOTE — H&P (View-Only) (Signed)
PATIENT PROFILE: Jeffrey Charles is a 49 y.o. male who presents to the Clinic for consultation at the request of Dr. Alice Reichert for evaluation of sigmoid polyp.  PCP:  [facility]  HISTORY OF PRESENT ILLNESS: [name] reports he had a colonoscopy last week and was found with a large sigmoid polyp.  The patient reported that he had a colonoscopy because he had 2 episode of diverticulitis and C. difficile during the last year.  He has been having constant left lower quadrant pain.  He was found with 4 x 3 cm distal sigmoid polyp.  Patient reports few episode of lower GI bleeding.  He denies weight loss.  He denies any family history of colon cancer.  There is mild pain in the left lower quadrant.  The pain does not radiate to other part of the body.  There is no alleviating or aggravating factor.  He denies diarrhea or constipation.  Of note is, the patient had 2 episode of complicated diverticulitis with abscess during 2020.  Both of them were treated with IV antibiotic therapy the abscesses were not amenable for drainage.  Patient has recovered well from this episode.  He has been following healthy diet since last episode.  PROBLEM LIST:         Problem List  Never Reviewed         Noted   Prediabetes 07/14/2018   Preventative health care 07/14/2018   Abdominal pain, LLQ (left lower quadrant) 07/14/2018   Tobacco use 07/14/2018   Well adult exam 07/14/2018   Health care maintenance 07/14/2018   Screening for STD (sexually transmitted disease) 07/14/2018   Allergic rhinitis 07/14/2018   History of diverticulitis 07/14/2018   Diverticulitis 06/28/2018   Diverticulitis of large intestine with abscess without bleeding 04/15/2018      GENERAL REVIEW OF SYSTEMS:   General ROS: negative for - chills, fatigue, fever, weight gain or weight loss Allergy and Immunology ROS: negative for - hives  Hematological and Lymphatic ROS: negative for - bleeding  problems or bruising, negative for palpable nodes Endocrine ROS: negative for - heat or cold intolerance, hair changes Respiratory ROS: negative for - cough, shortness of breath or wheezing Cardiovascular ROS: no chest pain or palpitations GI ROS: negative for nausea, vomiting, diarrhea, constipation.  Positive for abdominal pain Musculoskeletal ROS: negative for - joint swelling or muscle pain Neurological ROS: negative for - confusion, syncope Dermatological ROS: negative for pruritus and rash Psychiatric: negative for anxiety, depression, difficulty sleeping and memory loss  MEDICATIONS: Current Medications        Current Outpatient Medications  Medication Sig Dispense Refill  . metroNIDAZOLE (FLAGYL) 500 MG tablet Take 2 tablets at 2 pm, 3 pm and 10 pm the day before surgery. 6 tablet 0  . neomycin 500 mg tablet Take 2 tablets at 2 pm, 3 pm and 10 pm the day before surgery. 6 tablet 0   No current facility-administered medications for this visit.       ALLERGIES: Patient has no known allergies.  PAST MEDICAL HISTORY:     Past Medical History:  Diagnosis Date  . Abdominal pain, LLQ (left lower quadrant) 07/14/2018  . Diverticulitis 06/28/2018  . Diverticulitis of large intestine with abscess without bleeding 04/15/2018  . History of diverticulitis 07/14/2018  . Prediabetes 07/14/2018  . Preventative health care 07/14/2018    PAST SURGICAL HISTORY: History reviewed. No pertinent surgical history.   FAMILY HISTORY: Family History  Problem Relation Age of Onset  .  Diverticulitis Other   . Other Maternal Uncle        intestinal disease     SOCIAL HISTORY: Social History          Socioeconomic History  . Marital status: Single    Spouse name: Not on file  . Number of children: Not on file  . Years of education: Not on file  . Highest education level: Not on file  Occupational History  . Not on file  Social Needs  . Financial resource strain: Not on  file  . Food insecurity    Worry: Not on file    Inability: Not on file  . Transportation needs    Medical: Not on file    Non-medical: Not on file  Tobacco Use  . Smoking status: Never Smoker  . Smokeless tobacco: Never Used  Substance and Sexual Activity  . Alcohol use: Not Currently    Binge frequency: Never  . Drug use: Not Currently  . Sexual activity: Not on file  Other Topics Concern  . Not on file  Social History Narrative  . Not on file      PHYSICAL EXAM:    Vitals:   04/09/19 1427  BP: 138/87  Pulse: 60   Body mass index is 26.57 kg/m. Weight: 68 kg (150 lb)   GENERAL: Alert, active, oriented x3  HEENT: Pupils equal reactive to light. Extraocular movements are intact. Sclera clear. Palpebral conjunctiva normal red color.Pharynx clear.  NECK: Supple with no palpable mass and no adenopathy.  LUNGS: Sound clear with no rales rhonchi or wheezes.  HEART: Regular rhythm S1 and S2 without murmur.  ABDOMEN: Soft and depressible, nontender with no palpable mass, no hepatomegaly.   EXTREMITIES: Well-developed well-nourished symmetrical with no dependent edema.  NEUROLOGICAL: Awake alert oriented, facial expression symmetrical, moving all extremities.  REVIEW OF DATA: I have reviewed the following data today:      Initial consult on 04/09/2019  Component Date Value  . WBC (White Blood Cell Co* 04/09/2019 5.8   . RBC (Red Blood Cell Coun* 04/09/2019 5.25   . Hemoglobin 04/09/2019 15.2   . Hematocrit 04/09/2019 44.2   . MCV (Mean Corpuscular Vo* 04/09/2019 84.2   . MCH (Mean Corpuscular He* 04/09/2019 29.0   . MCHC (Mean Corpuscular H* 04/09/2019 34.4   . Platelet Count 04/09/2019 276   . RDW-CV (Red Cell Distrib* 04/09/2019 12.0   . MPV (Mean Platelet Volum* 04/09/2019 8.8*  . Neutrophils 04/09/2019 2.50   . Lymphocytes 04/09/2019 2.80   . Monocytes 04/09/2019 0.36   . Eosinophils 04/09/2019 0.12   . Basophils 04/09/2019 0.03    . Neutrophil % 04/09/2019 42.9   . Lymphocyte % 04/09/2019 48.1   . Monocyte % 04/09/2019 6.2   . Eosinophil % 04/09/2019 2.1   . Basophil% 04/09/2019 0.5   . Immature Granulocyte % 04/09/2019 0.2   . Immature Granulocyte Cou* 04/09/2019 0.01      ASSESSMENT: [name] is a 49 y.o. male presenting for consultation for large distal sigmoid polyp.  Patient was found with a large 4 x 3 cm sigmoid polyp.  Final pathology from sigmoid polyp biopsy count inflammatory polyp without atypia or malignancy.  There is no sign of adenoma.  Patient was oriented about these findings.  There is no sign of malignancy.  There is no increased risk of malignancy from this polyp.  This polyp was bloody from previous diverticulitis episode.  Patient was oriented that due to his multiple episode  of complicated diverticulitis with abscess and the concern of significant inflammation close to the bladder, surgical management for diverticulitis was discussed.  Recommendation of having partial colectomy with anastomosis and elective basis was discussed with the patient.  The goal of this management is to decrease the risk of having subsequent perforated diverticulitis.  I had a long discussion with the patient and the daughter about the surgical management that includes minimally invasive partial colectomy with anastomosis.  Risk of the patient the low risk of needing a colostomy.  Discussed with the patient the risk of surgery that includes bleeding, infection, injury to ureter, injury to bladder, need of more complicated repairs.  Also discussed with the patient the risk of anastomosis leak, abscess, obstruction and subsequent need of further surgery for repair 1 of these complications.  Also discussed with the patient the risk of anesthesia that includes pneumonia, DVT, cardiac complications, among others.  The patient and daughter report they understood and agreed to proceed with surgery.  Diverticulitis of  large intestine with abscess without bleeding [K57.20]  PLAN: 1. Robotic assisted laparoscopic Sigmoid colectomy with anastomosis (60109, [id number]) 2. CBC, CMP 3. Bowel prep the day before surgery 4. Will order pre op antibiotics to take at home the day before surgery 5. Contact us if has any question or concern.   Patient verbalized understanding, all questions were answered, and were agreeable with the plan outlined above.   I spent a total of 60 minutes in both face-to-face and non-face-to-face activities for this visit on the date of this encounter.   Carolan Shiver, MD  Electronically signed by Carolan Shiver, MD

## 2019-04-26 ENCOUNTER — Encounter
Admission: RE | Admit: 2019-04-26 | Discharge: 2019-04-26 | Disposition: A | Discharge status: Home / Self Care | Payer: BC Managed Care – PPO | Source: Ambulatory Visit | Attending: General Surgery | Admitting: General Surgery

## 2019-04-26 ENCOUNTER — Other Ambulatory Visit: Payer: Self-pay

## 2019-04-26 DIAGNOSIS — Z01818 Encounter for other preprocedural examination: Secondary | ICD-10-CM | POA: Insufficient documentation

## 2019-04-26 HISTORY — DX: Cardiac murmur, unspecified: R01.1

## 2019-04-26 NOTE — Patient Instructions (Signed)
Your procedure is scheduled on: Monday May 07, 2019 Su procedimiento est programado para: Lunes 12 de Abril del 2021 Report to Day Surgery. Presntese a: Leonie Douglas  To find out your arrival time please call [phone] between 1PM - 3PM on Friday May 04, 2019. Para saber su hora de llegada por favor llame al [phone] entre la 1PM - 3PM el da: Viernes 9 de Abril del 2021   Remember: Instructions that are not followed completely may result in serious medical risk, up to and including death,  or upon the discretion of your surgeon and anesthesiologist your surgery may need to be rescheduled.  Recuerde: Las instrucciones que no se siguen completamente Heritage manager en un riesgo de salud grave, incluyendo hasta  la [place] o a discrecin de su cirujano y Environmental health practitioner, su ciruga se puede posponer.   __X_ 1.Do not eat food after midnight the night before your procedure. No    gum chewing or hard candies. You may drink clear liquids up to 2 hours     before you are scheduled to arrive for your surgery- DO not drink clear     Liquids within 2 hours of the start of your surgery.     Clear Liquids include:    water, apple juice without pulp, clear carbohydrate drink such as    Clearfast of [place], Black Coffee or Tea (Do not add anything to coffee or tea).      No coma nada despus de la medianoche de la noche anterior a su    procedimiento. No coma chicles ni caramelos duros. Puede tomar    lquidos claros hasta 2 horas antes de su hora programada de llegada al     hospital para su procedimiento. No tome lquidos claros durante el     transcurso de las 2 horas de su llegada programada al hospital para su     procedimiento, ya que esto puede llevar a que su procedimiento se    retrase o tenga que volver a Health and safety inspector.  Los lquidos claros incluyen:          - Agua o jugo de [place] sin pulpa          - Bebidas claras con carbohidratos como ClearFast o Gatorade           - Caf negro o t claro (sin leche, sin cremas, no agregue nada al caf ni al t)  No tome nada que no est en esta lista.  Los pacientes con diabetes tipo 1 y tipo 2 solo deben Agricultural engineer.  Llame a la clnica de PreCare o a la unidad de Same Day Surgery si  tiene alguna pregunta sobre estas instrucciones.              _X__ 2.Do Not Smoke or use e-cigarettes For 24 Hours Prior to Your Surgery.    Do not use any chewable tobacco products for at least 6   hours prior to surgery.    No fume ni use cigarrillos electrnicos durante las 24 horas previas    a su [place].  No use ningn producto de tabaco masticable durante   al menos 6 horas antes de la [place].     __X_ 3. No alcohol for 24 hours before or after surgery.    No tome alcohol durante las 24 horas antes ni despus de la [place].    __x__4. Notify your doctor if there is any change in your medical condition (cold,fever, infections).  Informe a su mdico si hay algn cambio en su condicin mdica  (resfriado, fiebre, infecciones).   Do not wear jewelry, make-up, hairpins, clips or nail polish.  No use joyas, maquillajes, pinzas/ganchos para el cabello ni esmalte de uas.  Do not wear lotions, powders, or perfumes. You may wear deodorant.  No use lociones, polvos o perfumes.  Puede usar desodorante.    Do not shave 48 hours prior to surgery. Men may shave face and neck.  No se afeite 48 horas antes de la [place].  Los hombres pueden [employer] cara  y el cuello.   Do not bring valuables to the hospital.   No lleve objetos de valor al hospital.  [facility] is not responsible for any belongings or valuables.  [place] no se hace responsable de ningn tipo de pertenencias u objetos de Licensed conveyancer.               Contacts, dentures or bridgework may not be worn into surgery.  Los lentes de [place], las dentaduras postizas o puentes no se pueden usar en la [place].   Leave your suitcase in the car. After surgery it may be  brought to your room.  Deje su maleta en el auto.  Despus de la ciruga podr traerla a su habitacin.   For patients admitted to the hospital, discharge time is determined by your  treatment team.  Para los pacientes que sean ingresados al hospital, el tiempo en el cual se le  dar de alta es determinado por su equipo de [place].   Patients discharged the day of surgery will not be allowed to drive home. A los pacientes que se les da de alta el mismo da de la ciruga no se les permitir conducir a Higher education careers adviser.   Please read over the following fact sheets that you were given: Por favor lea las siguientes hojas de informacin que le dieron:     ____ Take these medicines the morning of surgery with A SIP OF WATER:          [employer] medicinas la maana de la ciruga con UN SORBO DE AGUA:  1. ninguna  __x__ Use CHG Soap as directed          Utilice el jabn de CHG segn lo indicado  __x__ Stop Anti-inflammatories such as ibuprofen, Aleve, naproxen, aspirin and or BC powders.          Deje de tomar antiinflamatorios como ibuprofen, Aleve, naproxen, apirinas o polvos de BC powders.    __x__ Stop supplements until after surgery            Deje de tomar suplementos hasta despus de la ciruga  __x__ Do not start any herbal supplements before your surgery.  No empieze a tomar suplementos de hierbas antes de su cirugia.

## 2019-05-03 ENCOUNTER — Other Ambulatory Visit
Admission: RE | Admit: 2019-05-03 | Discharge: 2019-05-03 | Disposition: A | Discharge status: Home / Self Care | Payer: BC Managed Care – PPO | Source: Ambulatory Visit | Attending: General Surgery | Admitting: General Surgery

## 2019-05-03 DIAGNOSIS — Z20822 Contact with and (suspected) exposure to covid-19: Secondary | ICD-10-CM | POA: Insufficient documentation

## 2019-05-03 DIAGNOSIS — Z01812 Encounter for preprocedural laboratory examination: Secondary | ICD-10-CM | POA: Insufficient documentation

## 2019-05-03 LAB — SARS CORONAVIRUS 2 (TAT 6-24 HRS): SARS Coronavirus 2: NEGATIVE

## 2019-05-07 ENCOUNTER — Encounter
Admission: RE | Disposition: A | Discharge status: Home / Self Care | Payer: Self-pay | Source: Home / Self Care | Attending: General Surgery

## 2019-05-07 ENCOUNTER — Inpatient Hospital Stay: Payer: BC Managed Care – PPO | Admitting: Certified Registered Nurse Anesthetist

## 2019-05-07 ENCOUNTER — Inpatient Hospital Stay
Admission: RE | Admit: 2019-05-07 | Discharge: 2019-05-10 | DRG: 331 | Disposition: A | Discharge status: Home / Self Care | Payer: BC Managed Care – PPO | Attending: General Surgery | Admitting: General Surgery

## 2019-05-07 ENCOUNTER — Encounter: Payer: Self-pay | Admitting: General Surgery

## 2019-05-07 ENCOUNTER — Other Ambulatory Visit: Payer: Self-pay

## 2019-05-07 DIAGNOSIS — K5720 Diverticulitis of large intestine with perforation and abscess without bleeding: Principal | ICD-10-CM | POA: Diagnosis present

## 2019-05-07 DIAGNOSIS — K635 Polyp of colon: Secondary | ICD-10-CM | POA: Diagnosis present

## 2019-05-07 DIAGNOSIS — R7303 Prediabetes: Secondary | ICD-10-CM | POA: Diagnosis present

## 2019-05-07 DIAGNOSIS — K5732 Diverticulitis of large intestine without perforation or abscess without bleeding: Secondary | ICD-10-CM | POA: Diagnosis present

## 2019-05-07 DIAGNOSIS — Z79899 Other long term (current) drug therapy: Secondary | ICD-10-CM

## 2019-05-07 DIAGNOSIS — K660 Peritoneal adhesions (postprocedural) (postinfection): Secondary | ICD-10-CM | POA: Diagnosis present

## 2019-05-07 HISTORY — PX: COLON SURGERY: SHX602

## 2019-05-07 SURGERY — COLECTOMY, PARTIAL, ROBOT-ASSISTED, LAPAROSCOPIC
Anesthesia: General | Site: Abdomen

## 2019-05-07 MED ORDER — FIBRIN SEALANT 2 ML SINGLE DOSE KIT
PACK | CUTANEOUS | Status: DC | PRN
  Administered 2019-05-07: 2 mL via TOPICAL

## 2019-05-07 MED ORDER — DEXAMETHASONE SODIUM PHOSPHATE 10 MG/ML IJ SOLN
INTRAMUSCULAR | Status: AC
  Filled 2019-05-07: qty 1

## 2019-05-07 MED ORDER — ONDANSETRON 4 MG PO TBDP: 4.0000 mg | ORAL_TABLET | Freq: Four times a day (QID) | ORAL | Status: DC | PRN

## 2019-05-07 MED ORDER — ACETAMINOPHEN 500 MG PO TABS
ORAL_TABLET | ORAL | Status: AC
  Administered 2019-05-07: 1000 mg via ORAL
  Filled 2019-05-07: qty 2

## 2019-05-07 MED ORDER — ACETAMINOPHEN 500 MG PO TABS: 1000.0000 mg | ORAL_TABLET | ORAL | Status: AC

## 2019-05-07 MED ORDER — CELECOXIB 200 MG PO CAPS: 200.0000 mg | ORAL_CAPSULE | ORAL | Status: AC

## 2019-05-07 MED ORDER — ONDANSETRON HCL 4 MG/2ML IJ SOLN
INTRAMUSCULAR | Status: AC
  Filled 2019-05-07: qty 2

## 2019-05-07 MED ORDER — DEXAMETHASONE SODIUM PHOSPHATE 10 MG/ML IJ SOLN
INTRAMUSCULAR | Status: DC | PRN
  Administered 2019-05-07: 10 mg via INTRAVENOUS

## 2019-05-07 MED ORDER — MIDAZOLAM HCL 2 MG/2ML IJ SOLN
INTRAMUSCULAR | Status: DC | PRN
  Administered 2019-05-07: 2 mg via INTRAVENOUS

## 2019-05-07 MED ORDER — BUPIVACAINE LIPOSOME 1.3 % IJ SUSP
INTRAMUSCULAR | Status: AC
  Filled 2019-05-07: qty 20

## 2019-05-07 MED ORDER — FENTANYL CITRATE (PF) 100 MCG/2ML IJ SOLN
INTRAMUSCULAR | Status: AC
  Filled 2019-05-07: qty 2

## 2019-05-07 MED ORDER — BUPIVACAINE LIPOSOME 1.3 % IJ SUSP
INTRAMUSCULAR | Status: DC | PRN
  Administered 2019-05-07: 20 mL

## 2019-05-07 MED ORDER — CELECOXIB 200 MG PO CAPS
ORAL_CAPSULE | ORAL | Status: AC
  Filled 2019-05-07: qty 1

## 2019-05-07 MED ORDER — SUGAMMADEX SODIUM 200 MG/2ML IV SOLN
INTRAVENOUS | Status: DC | PRN
  Administered 2019-05-07: 200 mg via INTRAVENOUS

## 2019-05-07 MED ORDER — SODIUM CHLORIDE 0.9 % IV SOLN
2.0000 g | INTRAVENOUS | Status: AC
  Administered 2019-05-07: 10:00:00 2 g via INTRAVENOUS
  Filled 2019-05-07: qty 2

## 2019-05-07 MED ORDER — FAMOTIDINE 20 MG PO TABS: 20.0000 mg | ORAL_TABLET | Freq: Once | ORAL | Status: AC

## 2019-05-07 MED ORDER — FENTANYL CITRATE (PF) 100 MCG/2ML IJ SOLN
INTRAMUSCULAR | Status: AC
  Administered 2019-05-07: 25 ug via INTRAVENOUS
  Filled 2019-05-07: qty 2

## 2019-05-07 MED ORDER — GABAPENTIN 300 MG PO CAPS
300.0000 mg | ORAL_CAPSULE | Freq: Two times a day (BID) | ORAL | Status: DC
  Administered 2019-05-07 – 2019-05-10 (×6): 300 mg via ORAL
  Filled 2019-05-07 (×6): qty 1

## 2019-05-07 MED ORDER — ENOXAPARIN SODIUM 40 MG/0.4ML ~~LOC~~ SOLN
40.0000 mg | SUBCUTANEOUS | Status: DC
  Administered 2019-05-08 – 2019-05-10 (×3): 40 mg via SUBCUTANEOUS
  Filled 2019-05-07 (×3): qty 0.4

## 2019-05-07 MED ORDER — CELECOXIB 200 MG PO CAPS
200.0000 mg | ORAL_CAPSULE | Freq: Two times a day (BID) | ORAL | Status: DC
  Administered 2019-05-07 – 2019-05-10 (×6): 200 mg via ORAL
  Filled 2019-05-07 (×7): qty 1

## 2019-05-07 MED ORDER — FENTANYL CITRATE (PF) 100 MCG/2ML IJ SOLN
25.0000 ug | INTRAMUSCULAR | Status: DC | PRN
  Administered 2019-05-07 (×3): 25 ug via INTRAVENOUS

## 2019-05-07 MED ORDER — ROCURONIUM BROMIDE 10 MG/ML (PF) SYRINGE
PREFILLED_SYRINGE | INTRAVENOUS | Status: AC
  Filled 2019-05-07: qty 10

## 2019-05-07 MED ORDER — POLYETHYLENE GLYCOL 3350 17 G PO PACK: 17.0000 g | PACK | Freq: Every day | ORAL | Status: DC | PRN

## 2019-05-07 MED ORDER — METHYLENE BLUE 0.5 % INJ SOLN
INTRAVENOUS | Status: AC
  Filled 2019-05-07: qty 10

## 2019-05-07 MED ORDER — SODIUM CHLORIDE (PF) 0.9 % IJ SOLN
INTRAMUSCULAR | Status: DC | PRN
  Administered 2019-05-07: 50 mL

## 2019-05-07 MED ORDER — MORPHINE SULFATE (PF) 4 MG/ML IV SOLN: 4.0000 mg | INTRAVENOUS | Status: DC | PRN

## 2019-05-07 MED ORDER — SODIUM CHLORIDE 0.9 % IV SOLN
2.0000 g | Freq: Two times a day (BID) | INTRAVENOUS | Status: AC
  Administered 2019-05-07 (×2): 2 g via INTRAVENOUS
  Filled 2019-05-07 (×2): qty 2

## 2019-05-07 MED ORDER — BUPIVACAINE LIPOSOME 1.3 % IJ SUSP: 20.0000 mL | Freq: Once | INTRAMUSCULAR | Status: DC

## 2019-05-07 MED ORDER — LACTATED RINGERS IV SOLN: INTRAVENOUS | Status: DC

## 2019-05-07 MED ORDER — FAMOTIDINE 20 MG PO TABS
ORAL_TABLET | ORAL | Status: AC
  Administered 2019-05-07: 20 mg via ORAL
  Filled 2019-05-07: qty 1

## 2019-05-07 MED ORDER — BUPIVACAINE HCL (PF) 0.5 % IJ SOLN
INTRAMUSCULAR | Status: AC
  Filled 2019-05-07: qty 30

## 2019-05-07 MED ORDER — CELECOXIB 200 MG PO CAPS
ORAL_CAPSULE | ORAL | Status: AC
  Administered 2019-05-07: 200 mg via ORAL
  Filled 2019-05-07: qty 1

## 2019-05-07 MED ORDER — ALVIMOPAN 12 MG PO CAPS
ORAL_CAPSULE | ORAL | Status: AC
  Administered 2019-05-07: 12 mg via ORAL
  Filled 2019-05-07: qty 1

## 2019-05-07 MED ORDER — GABAPENTIN 300 MG PO CAPS: 300.0000 mg | ORAL_CAPSULE | ORAL | Status: AC

## 2019-05-07 MED ORDER — FENTANYL CITRATE (PF) 100 MCG/2ML IJ SOLN
INTRAMUSCULAR | Status: DC | PRN
  Administered 2019-05-07 (×3): 50 ug via INTRAVENOUS
  Administered 2019-05-07: 100 ug via INTRAVENOUS
  Administered 2019-05-07: 50 ug via INTRAVENOUS

## 2019-05-07 MED ORDER — GLYCOPYRROLATE 0.2 MG/ML IJ SOLN
INTRAMUSCULAR | Status: DC | PRN
  Administered 2019-05-07: .15 mg via INTRAVENOUS

## 2019-05-07 MED ORDER — CHLORHEXIDINE GLUCONATE CLOTH 2 % EX PADS: 6.0000 | MEDICATED_PAD | Freq: Every day | CUTANEOUS | Status: DC

## 2019-05-07 MED ORDER — ROCURONIUM BROMIDE 100 MG/10ML IV SOLN
INTRAVENOUS | Status: DC | PRN
  Administered 2019-05-07: 30 mg via INTRAVENOUS
  Administered 2019-05-07: 20 mg via INTRAVENOUS
  Administered 2019-05-07: 50 mg via INTRAVENOUS
  Administered 2019-05-07: 20 mg via INTRAVENOUS
  Administered 2019-05-07: 50 mg via INTRAVENOUS
  Administered 2019-05-07: 20 mg via INTRAVENOUS

## 2019-05-07 MED ORDER — SODIUM CHLORIDE 0.9 % IR SOLN
Status: DC | PRN
  Administered 2019-05-07: 1000 mL
  Administered 2019-05-07: 1000 mL via INTRAVESICAL

## 2019-05-07 MED ORDER — PROPOFOL 10 MG/ML IV BOLUS
INTRAVENOUS | Status: DC | PRN
  Administered 2019-05-07: 140 mg via INTRAVENOUS

## 2019-05-07 MED ORDER — SODIUM CHLORIDE FLUSH 0.9 % IV SOLN
INTRAVENOUS | Status: AC
  Filled 2019-05-07: qty 10

## 2019-05-07 MED ORDER — HYDROCODONE-ACETAMINOPHEN 5-325 MG PO TABS
1.0000 | ORAL_TABLET | ORAL | Status: DC | PRN
  Administered 2019-05-07 – 2019-05-10 (×2): 2 via ORAL
  Filled 2019-05-07 (×2): qty 2

## 2019-05-07 MED ORDER — PANTOPRAZOLE SODIUM 40 MG IV SOLR
40.0000 mg | Freq: Every day | INTRAVENOUS | Status: DC
  Administered 2019-05-07 – 2019-05-09 (×3): 40 mg via INTRAVENOUS
  Filled 2019-05-07 (×3): qty 40

## 2019-05-07 MED ORDER — PROPOFOL 10 MG/ML IV BOLUS
INTRAVENOUS | Status: AC
  Filled 2019-05-07: qty 20

## 2019-05-07 MED ORDER — LIDOCAINE HCL (CARDIAC) PF 100 MG/5ML IV SOSY
PREFILLED_SYRINGE | INTRAVENOUS | Status: DC | PRN
  Administered 2019-05-07: 80 mg via INTRAVENOUS

## 2019-05-07 MED ORDER — SODIUM CHLORIDE 0.9 % IV SOLN: INTRAVENOUS | Status: DC

## 2019-05-07 MED ORDER — PHENYLEPHRINE HCL (PRESSORS) 10 MG/ML IV SOLN
INTRAVENOUS | Status: DC | PRN
  Administered 2019-05-07: 50 ug via INTRAVENOUS

## 2019-05-07 MED ORDER — 0.9 % SODIUM CHLORIDE (POUR BTL) OPTIME
TOPICAL | Status: DC | PRN
  Administered 2019-05-07: 15:00:00 500 mL
  Administered 2019-05-07: 1000 mL

## 2019-05-07 MED ORDER — ALVIMOPAN 12 MG PO CAPS: 12.0000 mg | ORAL_CAPSULE | ORAL | Status: AC

## 2019-05-07 MED ORDER — METHYLENE BLUE 0.5 % INJ SOLN
INTRAVENOUS | Status: DC | PRN
  Administered 2019-05-07: 5 mL

## 2019-05-07 MED ORDER — BUPIVACAINE HCL (PF) 0.5 % IJ SOLN
INTRAMUSCULAR | Status: DC | PRN
  Administered 2019-05-07: 30 mL

## 2019-05-07 MED ORDER — SODIUM CHLORIDE (PF) 0.9 % IJ SOLN
INTRAMUSCULAR | Status: AC
  Filled 2019-05-07: qty 50

## 2019-05-07 MED ORDER — GABAPENTIN 300 MG PO CAPS
ORAL_CAPSULE | ORAL | Status: AC
  Administered 2019-05-07: 300 mg via ORAL
  Filled 2019-05-07: qty 1

## 2019-05-07 MED ORDER — ONDANSETRON HCL 4 MG/2ML IJ SOLN: 4.0000 mg | Freq: Once | INTRAMUSCULAR | Status: DC | PRN

## 2019-05-07 MED ORDER — MIDAZOLAM HCL 2 MG/2ML IJ SOLN
INTRAMUSCULAR | Status: AC
  Filled 2019-05-07: qty 2

## 2019-05-07 MED ORDER — ONDANSETRON HCL 4 MG/2ML IJ SOLN: 4.0000 mg | Freq: Four times a day (QID) | INTRAMUSCULAR | Status: DC | PRN

## 2019-05-07 MED ORDER — ONDANSETRON HCL 4 MG/2ML IJ SOLN
INTRAMUSCULAR | Status: DC | PRN
  Administered 2019-05-07: 4 mg via INTRAVENOUS

## 2019-05-07 SURGICAL SUPPLY — 98 items
APPLICATOR VISTASEAL 35 (MISCELLANEOUS) ×2 IMPLANT
BLADE CLIPPER SURG (BLADE) ×3 IMPLANT
BLADE SURG SZ10 CARB STEEL (BLADE) ×3 IMPLANT
BLADE SURG SZ11 CARB STEEL (BLADE) ×3 IMPLANT
CANISTER SUCT 1200ML W/VALVE (MISCELLANEOUS) ×3 IMPLANT
CANNULA REDUC XI 12-8 STAPL (CANNULA) ×1
CANNULA REDUC XI 12-8MM STAPL (CANNULA) ×1
CANNULA REDUCER 12-8 DVNC XI (CANNULA) ×1 IMPLANT
CHLORAPREP W/TINT 26 (MISCELLANEOUS) ×3 IMPLANT
CLIP VESOLOCK MED LG 6/CT (CLIP) IMPLANT
COVER TIP SHEARS 8 DVNC (MISCELLANEOUS) ×1 IMPLANT
COVER TIP SHEARS 8MM DA VINCI (MISCELLANEOUS) ×2
COVER WAND RF STERILE (DRAPES) IMPLANT
DEFOGGER SCOPE WARMER CLEARIFY (MISCELLANEOUS) ×3 IMPLANT
DERMABOND ADVANCED (GAUZE/BANDAGES/DRESSINGS)
DERMABOND ADVANCED .7 DNX12 (GAUZE/BANDAGES/DRESSINGS) IMPLANT
DRAPE 3/4 80X56 (DRAPES) ×3 IMPLANT
DRAPE ARM DVNC X/XI (DISPOSABLE) ×4 IMPLANT
DRAPE COLUMN DVNC XI (DISPOSABLE) ×1 IMPLANT
DRAPE DA VINCI XI ARM (DISPOSABLE) ×8
DRAPE DA VINCI XI COLUMN (DISPOSABLE) ×2
DRAPE UNDER BUTTOCK W/FLU (DRAPES) ×3 IMPLANT
DRSG OPSITE POSTOP 4X10 (GAUZE/BANDAGES/DRESSINGS) IMPLANT
DRSG OPSITE POSTOP 4X8 (GAUZE/BANDAGES/DRESSINGS) IMPLANT
ELECT BLADE 6.5 EXT (BLADE) IMPLANT
ELECT CAUTERY BLADE 6.4 (BLADE) IMPLANT
ELECT REM PT RETURN 9FT ADLT (ELECTROSURGICAL) ×3
ELECTRODE REM PT RTRN 9FT ADLT (ELECTROSURGICAL) ×1 IMPLANT
GLOVE BIO SURGEON STRL SZ 6.5 (GLOVE) ×6 IMPLANT
GLOVE BIO SURGEONS STRL SZ 6.5 (GLOVE) ×3
GLOVE BIOGEL PI IND STRL 6.5 (GLOVE) ×3 IMPLANT
GLOVE BIOGEL PI INDICATOR 6.5 (GLOVE) ×6
GOWN STRL REUS W/ TWL LRG LVL3 (GOWN DISPOSABLE) ×6 IMPLANT
GOWN STRL REUS W/TWL LRG LVL3 (GOWN DISPOSABLE) ×12
GRASPER SUT TROCAR 14GX15 (MISCELLANEOUS) IMPLANT
HANDLE YANKAUER SUCT BULB TIP (MISCELLANEOUS) ×3 IMPLANT
IRRIGATION STRYKERFLOW (MISCELLANEOUS) IMPLANT
IRRIGATOR STRYKERFLOW (MISCELLANEOUS) ×3
IRRIGATOR SUCT 8 DISP DVNC XI (IRRIGATION / IRRIGATOR) IMPLANT
IRRIGATOR SUCTION 8MM XI DISP (IRRIGATION / IRRIGATOR)
IV NS 1000ML (IV SOLUTION)
IV NS 1000ML BAXH (IV SOLUTION) IMPLANT
KIT IMAGING PINPOINTPAQ (MISCELLANEOUS) ×2 IMPLANT
KIT PINK PAD W/HEAD ARE REST (MISCELLANEOUS) ×3
KIT PINK PAD W/HEAD ARM REST (MISCELLANEOUS) ×1 IMPLANT
LABEL OR SOLS (LABEL) IMPLANT
LEGGING LITHOTOMY PAIR STRL (DRAPES) ×3 IMPLANT
NEEDLE HYPO 22GX1.5 SAFETY (NEEDLE) ×3 IMPLANT
OBTURATOR OPTICAL STANDARD 8MM (TROCAR) ×2
OBTURATOR OPTICAL STND 8 DVNC (TROCAR) ×1
OBTURATOR OPTICALSTD 8 DVNC (TROCAR) ×1 IMPLANT
PACK COLON CLEAN CLOSURE (MISCELLANEOUS) ×3 IMPLANT
PACK LAP CHOLECYSTECTOMY (MISCELLANEOUS) ×3 IMPLANT
PENCIL ELECTRO HAND CTR (MISCELLANEOUS) ×3 IMPLANT
PORT ACCESS TROCAR AIRSEAL 5 (TROCAR) ×3 IMPLANT
RELOAD STAPLE 45 3.5 BLU DVNC (STAPLE) IMPLANT
RELOAD STAPLE 60 3.5 BLU DVNC (STAPLE) IMPLANT
RELOAD STAPLER 3.5X45 BLU DVNC (STAPLE) IMPLANT
RELOAD STAPLER 3.5X60 BLU DVNC (STAPLE) ×4 IMPLANT
RETRACTOR RING XSMALL (MISCELLANEOUS) IMPLANT
RTRCTR WOUND ALEXIS 13CM XS SH (MISCELLANEOUS)
SEAL CANN UNIV 5-8 DVNC XI (MISCELLANEOUS) ×3 IMPLANT
SEAL XI 5MM-8MM UNIVERSAL (MISCELLANEOUS) ×8
SEALER VESSEL DA VINCI XI (MISCELLANEOUS) ×2
SEALER VESSEL EXT DVNC XI (MISCELLANEOUS) IMPLANT
SET TRI-LUMEN FLTR TB AIRSEAL (TUBING) ×3 IMPLANT
SOL PREP PVP 2OZ (MISCELLANEOUS) ×3
SOLUTION ELECTROLUBE (MISCELLANEOUS) ×3 IMPLANT
SOLUTION PREP PVP 2OZ (MISCELLANEOUS) ×1 IMPLANT
SPONGE LAP 4X18 RFD (DISPOSABLE) ×3 IMPLANT
STAPLER 45 DA VINCI SURE FORM (STAPLE)
STAPLER 45 SUREFORM DVNC (STAPLE) IMPLANT
STAPLER 60 DA VINCI SURE FORM (STAPLE) ×2
STAPLER 60 SUREFORM DVNC (STAPLE) IMPLANT
STAPLER CANNULA SEAL DVNC XI (STAPLE) ×1 IMPLANT
STAPLER CANNULA SEAL XI (STAPLE) ×2
STAPLER RELOAD 3.5X45 BLU DVNC (STAPLE)
STAPLER RELOAD 3.5X45 BLUE (STAPLE)
STAPLER RELOAD 3.5X60 BLU DVNC (STAPLE) ×4
STAPLER RELOAD 3.5X60 BLUE (STAPLE) ×8
SURGILUBE 2OZ TUBE FLIPTOP (MISCELLANEOUS) ×3 IMPLANT
SUT MNCRL 4-0 (SUTURE) ×4
SUT MNCRL 4-0 27XMFL (SUTURE) ×2
SUT MNCRL AB 4-0 PS2 18 (SUTURE) ×3 IMPLANT
SUT PDS PLUS 0 (SUTURE) ×4
SUT PDS PLUS AB 0 CT-2 (SUTURE) ×2 IMPLANT
SUT SILK 0 SH 30 (SUTURE) ×6 IMPLANT
SUT SILK 3-0 (SUTURE) IMPLANT
SUT VIC AB 3-0 SH 27 (SUTURE) ×8
SUT VIC AB 3-0 SH 27X BRD (SUTURE) ×4 IMPLANT
SUT VICRYL 0 AB UR-6 (SUTURE) ×6 IMPLANT
SUT VLOC 90 6 CV-15 VIOLET (SUTURE) ×2 IMPLANT
SUT VLOC 90 6" CV-15 VIOLET (SUTURE) ×1
SUTURE MNCRL 4-0 27XMF (SUTURE) ×2 IMPLANT
SYR 30ML LL (SYRINGE) ×6 IMPLANT
SYS TROCAR 1.5-3 SLV ABD GEL (ENDOMECHANICALS) ×3
SYSTEM TROCR 1.5-3 SLV ABD GEL (ENDOMECHANICALS) ×1 IMPLANT
TRAY FOLEY MTR SLVR 16FR STAT (SET/KITS/TRAYS/PACK) ×3 IMPLANT

## 2019-05-07 NOTE — Anesthesia Preprocedure Evaluation (Addendum)
Anesthesia Evaluation  Patient identified by MRN, date of birth, ID band Patient awake    Reviewed: Allergy & Precautions, NPO status , Patient's Chart, lab work & pertinent test results  History of Anesthesia Complications Negative for: history of anesthetic complications  Airway Mallampati: II       Dental   Pulmonary neg sleep apnea, neg COPD, Not current smoker,           Cardiovascular (-) hypertension(-) Past MI and (-) CHF (-) dysrhythmias + Valvular Problems/Murmurs (murmur at 49 yo, no symptoms)      Neuro/Psych neg Seizures    GI/Hepatic Neg liver ROS, neg GERD  ,  Endo/Other  neg diabetes  Renal/GU negative Renal ROS     Musculoskeletal   Abdominal   Peds  Hematology   Anesthesia Other Findings   Reproductive/Obstetrics                             Anesthesia Physical Anesthesia Plan  ASA: II  Anesthesia Plan: General   Post-op Pain Management:    Induction: Intravenous  PONV Risk Score and Plan: 2 and Ondansetron and Dexamethasone  Airway Management Planned: Oral ETT  Additional Equipment:   Intra-op Plan:   Post-operative Plan:   Informed Consent: I have reviewed the patients History and Physical, chart, labs and discussed the procedure including the risks, benefits and alternatives for the proposed anesthesia with the patient or authorized representative who has indicated his/her understanding and acceptance.       Plan Discussed with:   Anesthesia Plan Comments:         Anesthesia Quick Evaluation

## 2019-05-07 NOTE — Op Note (Signed)
Preoperative diagnosis: History of complicated diverticulitis  Postoperative diagnosis: History of complicated diverticuliti  Procedure: Robotic assisted laparoscopic sigmoid colectomy.                       Robotic assisted laparoscopic splenic flexure takedown.     Anesthesia: GETA   Surgeon: Herbert Pun, MD  Assistant: Dr. Lysle Pearl    Wound Classification: Clean contaminated   Specimen: Sigmoid colon   Complications: None   Estimated Blood Loss: 100 mL   Indications: Patient is a 49 y.o. male withhistory of diverticulitis with abscess treated with conservative management. Colonoscopy with finding of inflammatory polyp.. Elective resection was indicated.     FIndings: 1.  Sigmoid polyp with tattoo 2. Severe adhesion of the sigmoid to bladder. No methylene blued leak after separation of sigmoid to bladder.  3.  Adequate hemostasis.  4.  No gross metastasis noted   Description of procedure: The patient was placed on the operating table in the lithotomy position, both arms tucked. General anesthesia was induced. A time-out was completed verifying correct patient, procedure, site, positioning, and implant(s) and/or special equipment prior to beginning this procedure. The abdomen was prepped and draped in the usual sterile fashion.    A small 3 cm incision was done in the right lower quadrant.  Dissection was carried down to anterior fascia.  Fascia was opened with Bovie catheter.  Posterior fascia was grabbed with hemostats.  Posterior fascia and peritoneum was opened with Metzenbaum scissors.  Mini GelPort was placed.  Abdominal cavity was insufflated to 15 mmHg. Patient tolerated insufflation well.  3 additional 8 mm ports were made 8 cm apart along the right side of the abdominal wall. 5 mm assistant port was then placed on the right subcostal area.  No injuries from trocar placements were noted. The table was placed in the Trendelenburg position.  Xi robotic platform was then  brought to the operative field and docked at an angle from the left lower quadrant.  Tip up grasper and fenestrated bipolar were placed in the left arm ports.  Scissors were placed in right arm port.   Sigmoid colon identified adhered to the urinary bladder.  Very difficult and time-consuming dissection was done until the sigmoid colon was able to be freed from the bladder.  Chronic abscess identified and drained.  Then, the sigmoid and descending colon where mobilized from the lateral attachment following the [employer]. This was followed up to the splenic flexure. The splenic flexure was taken down and mobilized to let the distal end of the descending colon reach the rectum without tension. When mobilized, a small window on the mesentery of the distal descending colon where healthy colon was identified was done. Then the mesentery was divided with Vessel Sealer device. The mesentery was divided down to the rectosigmoid junction. At this point the rectosigmoid junction was divided with stapler device. 2.5 mg of ICG green was flushed intravenously and the site of adequate vascularity was identified. A colotomy was done distal to the proximal point of transection. An anvil was inserted through the colotomy and advanced proximally. The descending colon was transected with stapler device. The anvil was pulled through the staple line. Once the descending colon was identified reaching the rectum without tension, the assistant surgeon introduced the 29 mm EEA rectally. It was guided under direct vision up to the level of the distal staple line on the rectal stump. The spike of the EEA device was then  deployed to pierce the rectal stump, the anvil was then attached to this spike, and the EEA device is closed and fired to perform a stapled end-to-end anastomosis. The doughnuts produced by the EEA stapler were checked to ensure that they were complete. Furthermore, an air leak test was carried out by insufflating  air gently into the rectum, while the anastomosis was bathed underwater. A clamp was placed on the proximal colon to prevent its distention. Once the anastomosis was properly tested, the patient was repositioned back in the normal anatomical position. The specimen was removed through the right lower quadrant incision. The trocars were removed under direct vision and the fascia of the right lower quadrant was closed with a #0 PDS suture. The skin was closed with 4-0 Monocryl sutures in a running fashion and a dry sterile dressing is applied. The sponge and instrument count were correct, blood loss was minimal, and there were no complications.    The patient tolerated the procedure well, awakened from anesthesia and was taken to the postanesthesia care unit in satisfactory condition.  Foley still in place.  Sponge count and instrument count correct at the end of the procedure.

## 2019-05-07 NOTE — Anesthesia Procedure Notes (Signed)
Procedure Name: Intubation Performed by: Tynesha Free, CRNA Pre-anesthesia Checklist: Patient identified, Patient being monitored, Timeout performed, Emergency Drugs available and Suction available Patient Re-evaluated:Patient Re-evaluated prior to induction Oxygen Delivery Method: Circle system utilized Preoxygenation: Pre-oxygenation with 100% oxygen Induction Type: IV induction Ventilation: Mask ventilation without difficulty Laryngoscope Size: Mac and 3 Grade View: Grade I Tube type: Oral Tube size: 7.0 mm Number of attempts: 1 Airway Equipment and Method: Stylet Placement Confirmation: ETT inserted through vocal cords under direct vision,  positive ETCO2 and breath sounds checked- equal and bilateral Secured at: 21 cm Tube secured with: Tape Dental Injury: Teeth and Oropharynx as per pre-operative assessment        

## 2019-05-07 NOTE — Interval H&P Note (Signed)
History and Physical Interval Note:  05/07/2019 9:27 AM  Jeffrey Charles  has presented today for surgery, with the diagnosis of K57.20 Diverticulitis of lg intestine w abscess w/o bleeding.  The various methods of treatment have been discussed with the patient and family. After consideration of risks, benefits and other options for treatment, the patient has consented to  Procedure(s): XI ROBOT ASSISTED LAPAROSCOPIC Sigmoid COLECTOMY w/ anastamosis (N/A) as a surgical intervention.  The patient's history has been reviewed, patient examined, no change in status, stable for surgery.  I have reviewed the patient's chart and labs.  Questions were answered to the patient's satisfaction.     Carolan Shiver

## 2019-05-07 NOTE — Transfer of Care (Signed)
Immediate Anesthesia Transfer of Care Note  Patient: Jeffrey Charles  Procedure(s) Performed: XI ROBOT ASSISTED LAPAROSCOPIC SIGMOID COLECTOMY WITH ANASTAMOSIS (N/A Abdomen)  Patient Location: PACU  Anesthesia Type:General  Level of Consciousness: drowsy  Airway & Oxygen Therapy: Patient Spontanous Breathing  Post-op Assessment: Report given to RN and Post -op Vital signs reviewed and stable  Post vital signs: Reviewed and stable  Last Vitals:  Vitals Value Taken Time  BP 114/72 05/07/19 1616  Temp 36.7 C 05/07/19 1616  Pulse 76 05/07/19 1619  Resp 15 05/07/19 1619  SpO2 99 % 05/07/19 1619  Vitals shown include unvalidated device data.  Last Pain:  Vitals:   05/07/19 1616  TempSrc:   PainSc: Asleep         Complications: No apparent anesthesia complications

## 2019-05-08 LAB — BASIC METABOLIC PANEL
Anion gap: 5 (ref 5–15)
BUN: 19 mg/dL (ref 6–20)
CO2: 24 mmol/L (ref 22–32)
Calcium: 8.2 mg/dL — ABNORMAL LOW (ref 8.9–10.3)
Chloride: 109 mmol/L (ref 98–111)
Creatinine, Ser: 0.87 mg/dL (ref 0.61–1.24)
GFR calc Af Amer: >60 mL/min (ref >60)
GFR calc non Af Amer: >60 mL/min (ref >60)
Glucose, Bld: 135 mg/dL — ABNORMAL HIGH (ref 70–99)
Potassium: 4.2 mmol/L (ref 3.5–5.1)
Sodium: 138 mmol/L (ref 135–145)

## 2019-05-08 LAB — CBC
HCT: 38.1 % — ABNORMAL LOW (ref 39.0–52.0)
Hemoglobin: 13.3 g/dL (ref 13.0–17.0)
MCH: 29 pg (ref 26.0–34.0)
MCHC: 34.9 g/dL (ref 30.0–36.0)
MCV: 83.2 fL (ref 80.0–100.0)
Platelets: 276 10*3/uL (ref 150–400)
RBC: 4.58 MIL/uL (ref 4.22–5.81)
RDW: 12.5 % (ref 11.5–15.5)
WBC: 9.1 10*3/uL (ref 4.0–10.5)
nRBC: 0 % (ref 0.0–0.2)

## 2019-05-08 LAB — MAGNESIUM: Magnesium: 2.4 mg/dL (ref 1.7–2.4)

## 2019-05-08 LAB — PHOSPHORUS: Phosphorus: 2.7 mg/dL (ref 2.5–4.6)

## 2019-05-08 MED ORDER — DIPHENHYDRAMINE HCL 25 MG PO CAPS
25.0000 mg | ORAL_CAPSULE | Freq: Once | ORAL | Status: AC
  Administered 2019-05-08: 25 mg via ORAL
  Filled 2019-05-08: qty 1

## 2019-05-08 NOTE — Anesthesia Postprocedure Evaluation (Signed)
Anesthesia Post Note  Patient: Jeffrey Charles  Procedure(s) Performed: XI ROBOT ASSISTED LAPAROSCOPIC SIGMOID COLECTOMY WITH ANASTAMOSIS (N/A Abdomen)  Patient location during evaluation: PACU Anesthesia Type: General Level of consciousness: awake and alert and oriented Pain management: pain level controlled Vital Signs Assessment: post-procedure vital signs reviewed and stable Respiratory status: spontaneous breathing, nonlabored ventilation and respiratory function stable Cardiovascular status: blood pressure returned to baseline and stable Postop Assessment: no signs of nausea or vomiting Anesthetic complications: no     Last Vitals:  Vitals:   05/07/19 2252 05/08/19 0649  BP: 130/83 118/84  Pulse: 67 (!) 58  Resp: 20 16  Temp: 37.2 C 36.6 C  SpO2:  99%    Last Pain:  Vitals:   05/08/19 0649  TempSrc: Oral  PainSc:                  [name]

## 2019-05-08 NOTE — Progress Notes (Signed)
SURGICAL PROGRESS NOTE   Hospital Day(s): 1.   Post op day(s): 1 Day Post-Op.   Interval History: Patient seen and examined, no acute events or new complaints overnight. Patient reports feeling well. Patient with expected soreness on abdominal wall. Reports passing gas and having bowel movement. Denies nausea and vomiting.   Vital signs in last 24 hours: [min-max] current  Temp:  [97.8 F (36.6 C)-98.9 F (37.2 C)] 98.4 F (36.9 C) (04/13 1153) Pulse Rate:  [58-67] 62 (04/13 1153) Resp:  [16-20] 20 (04/13 1153) BP: (118-130)/(83-84) 119/84 (04/13 1153) SpO2:  [98 %-100 %] 98 % (04/13 1153)             Physical Exam:  Constitutional: alert, cooperative and no distress  Respiratory: breathing non-labored at rest  Cardiovascular: regular rate and sinus rhythm  Gastrointestinal: soft, non-tender, and non-distended. Wounds are dry and clean.   Labs:  CBC Latest Ref Rng & Units 05/08/2019 06/30/2018 06/29/2018  WBC 4.0 - 10.5 K/uL 9.1 4.9 3.5(L)  Hemoglobin 13.0 - 17.0 g/dL 84.1 32.4 40.1  Hematocrit 39.0 - 52.0 % 38.1(L) 40.4 40.5  Platelets 150 - 400 K/uL 276 291 275   CMP Latest Ref Rng & Units 05/08/2019 06/30/2018 06/29/2018  Glucose 70 - 99 mg/dL 027(O) 70 536(U)  BUN 6 - 20 mg/dL 19 6 6   Creatinine 0.61 - 1.24 mg/dL 4.40 3.47  Sodium 135 - 145 mmol/L 138 141 139  Potassium 3.5 - 5.1 mmol/L 4.2 3.4(L) 3.6  Chloride 98 - 111 mmol/L 109 108 108  CO2 22 - 32 mmol/L 24 23 22   Calcium 8.9 - 10.3 mg/dL 8.2(L) 8.6(L) 8.3(L)  Total Protein 6.5 - 8.1 g/dL - 6.0(L) 5.6(L)  Total Bilirubin 0.3 - 1.2 mg/dL - 0.6 0.9  Alkaline Phos 38 - 126 U/L - 66 66  AST 15 - 41 U/L - 15 14(L)  ALT 0 - 44 U/L - 14 14    Imaging studies: No new pertinent imaging studies   Assessment/Plan:  49 y.o. male with history of complex diverticulitis with abscess 1 Day Post-Op s/p robotic assisted partial colectomy and anastomosis.  Recovering adequately. Pain controlled. WBC within normal limits.  Hemoglobin stable. No fever or tachycardia. Voiding spontaneously. Tolerated clear liquid and advanced to full liquid. Will advance to soft diet. Will continue pain control. Will continue DVT prophylaxis. Encourage to ambulate.   , MD

## 2019-05-08 NOTE — Progress Notes (Signed)
Foley removed. Patient informed of need for urine by 1730.

## 2019-05-09 NOTE — Progress Notes (Signed)
SURGICAL PROGRESS NOTE   Hospital Day(s): 2.   Post op day(s): 2 Days Post-Op.   Interval History: Patient seen and examined, no acute events or new complaints overnight. Patient reports feeling well today.  Minimal pain on the epigastric area.  Denies pain in the lower abdomen.  Reports having bowel movement and passing gas.  Denies nausea or vomiting.  Vital signs in last 24 hours: [min-max] current  Temp:  [97.7 F (36.5 C)-98.7 F (37.1 C)] 98.7 F (37.1 C) (04/14 1133) Pulse Rate:  [52-64] 64 (04/14 1133) Resp:  [16-18] 16 (04/14 1133) BP: (103-122)/(79-83) 116/79 (04/14 1133) SpO2:  [99 %-100 %] 100 % (04/14 1133)             Physical Exam:  Constitutional: alert, cooperative and no distress  Respiratory: breathing non-labored at rest  Cardiovascular: regular rate and sinus rhythm  Gastrointestinal: soft, non-tender, and non-distended. Wound are dry and clean  Labs:  CBC Latest Ref Rng & Units 05/08/2019 06/30/2018 06/29/2018  WBC 4.0 - 10.5 K/uL 9.1 4.9 3.5(L)  Hemoglobin 13.0 - 17.0 g/dL 18.2 99.3 71.6  Hematocrit 39.0 - 52.0 % 38.1(L) 40.4 40.5  Platelets 150 - 400 K/uL 276 291 275   CMP Latest Ref Rng & Units 05/08/2019 06/30/2018 06/29/2018  Glucose 70 - 99 mg/dL 967(E) 70 938(B)  BUN 6 - 20 mg/dL 19 6 6   Creatinine 0.61 - 1.24 mg/dL 0.17 5.10  Sodium 135 - 145 mmol/L 138 141 139  Potassium 3.5 - 5.1 mmol/L 4.2 3.4(L) 3.6  Chloride 98 - 111 mmol/L 109 108 108  CO2 22 - 32 mmol/L 24 23 22   Calcium 8.9 - 10.3 mg/dL 8.2(L) 8.6(L) 8.3(L)  Total Protein 6.5 - 8.1 g/dL - 6.0(L) 5.6(L)  Total Bilirubin 0.3 - 1.2 mg/dL - 0.6 0.9  Alkaline Phos 38 - 126 U/L - 66 66  AST 15 - 41 U/L - 15 14(L)  ALT 0 - 44 U/L - 14 14    Imaging studies: No new pertinent imaging studies   Assessment/Plan:  49 y.o. male with history of complex diverticulitis with abscess 2 Day Post-Op s/p robotic assisted partial colectomy and anastomosis.  Patient recovering adequately.  Today  continue to pass gas and having bowel movement.  Will advance diet to soft diet.  We will continue with pain management.  We will continue with DVT prophylaxis.  I encouraged the patient to ambulate.  Will assess diet toleration.  We will continue to follow clinically with physical exam and vital signs.  , MD

## 2019-05-10 LAB — SURGICAL PATHOLOGY

## 2019-05-10 MED ORDER — PANTOPRAZOLE SODIUM 40 MG PO TBEC: 40.0000 mg | DELAYED_RELEASE_TABLET | Freq: Every day | ORAL | Status: DC

## 2019-05-10 MED ORDER — HYDROCODONE-ACETAMINOPHEN 5-325 MG PO TABS: 1.0000 | ORAL_TABLET | ORAL | 0 refills | Status: AC | PRN

## 2019-05-10 NOTE — Discharge Instructions (Signed)
?  Diet: Resume soft low fiber diet for 2 weeks. After two weeks, start regular high fiber diet. It is preferred to have frequent small meals than having 3 big meals a day. Drink at least 8 glasses of water daily.  ? ?Activity: No heavy lifting >10 pounds (children, pets, laundry, garbage) or strenuous activity for the next 6 weeks, but light activity and walking are encouraged.  ? ?It is normal to feel tire most of the time since your body is using energy to heal.   ? ?Wound care: May shower with soapy water and pat dry (do not rub incisions), but no baths or submerging incision underwater until follow-up. (no swimming)  ? ?Do not smoke, since smoking delays the process of healing, among other negative effects.  ? ?Call the office if the wound becomes red and start to drain pus.  ? ?Medications: Resume all home medications. For mild to moderate pain: acetaminophen (Tylenol) or ibuprofen (if no kidney disease). Combining Tylenol with alcohol can substantially increase your risk of causing liver disease. Narcotic pain medications, if prescribed, can be used for severe pain, though may cause nausea, constipation, and drowsiness. Do not combine Tylenol and Percocet within a 6 hour period as Percocet contains Tylenol. If you do not need the narcotic pain medication, you do not need to fill the prescription. Do not drink alcohol while taking narcotics.  ? ?Call office ([phone]) at any time if any questions, worsening pain, fevers/chills, bleeding, drainage from incision site, or other concerns. ? ?

## 2019-05-10 NOTE — Discharge Summary (Signed)
  Patient ID: Cashtyn Pouliot MRN: [id number] DOB/AGE: 05-10-1970 49 y.o.  Admit date: 05/07/2019 Discharge date: 05/10/2019   Discharge Diagnoses:  Active Problems:   Diverticulitis large intestine w/o perforation or abscess w/o bleeding   Procedures: Robotic assisted laparoscopic sigmoid colectomy  Hospital Course: Patient underwent robotic assisted laparoscopic partial colectomy for history of complicated diverticulitis with abscess. Patient tolerated the procedure well. Patient ambulating without difficulty. Patient voiding spontaneously. Patient passing gas and having bowel movement. Tolerated soft diet today. No nausea or vomiting. No issues with wounds.  Physical Exam  Constitutional: He is oriented to person, place, and time and well-developed, well-nourished, and in no distress.  Cardiovascular: Normal rate.  Pulmonary/Chest: Effort normal.  Abdominal: Soft. He exhibits no distension. There is no abdominal tenderness. There is no rebound and no guarding.  Neurological: He is alert and oriented to person, place, and time.  Skin: Skin is warm.  Wounds are dry and clean.    Consults: None  Disposition: Discharge disposition: 01-Home or Self Care       Discharge Instructions    Diet - low sodium heart healthy   Complete by: As directed    Increase activity slowly   Complete by: As directed      Allergies as of 05/10/2019   No Known Allergies     Medication List    TAKE these medications   HYDROcodone-acetaminophen 5-325 MG tablet Commonly known as: Norco Take 1 tablet by mouth every 4 (four) hours as needed for up to 3 days for moderate pain.

## 2019-05-10 NOTE — Progress Notes (Signed)
Jeffrey Charles  A and O x 4 VSS. Pt tolerating diet well. No complaints of pain or nausea. IV removed intact, prescriptions given. Pt voices understanding of discharge instructions with no further questions. Pt discharged via wheelchair with axillary.   Allergies as of 05/10/2019   No Known Allergies     Medication List    TAKE these medications   HYDROcodone-acetaminophen 5-325 MG tablet Commonly known as: Norco Take 1 tablet by mouth every 4 (four) hours as needed for up to 3 days for moderate pain.       Vitals:   05/10/19 0812 05/10/19 1221  BP: 125/84 116/68  Pulse: (!) 59 62  Resp: 16 16  Temp: 97.9 F (36.6 C) 98.1 F (36.7 C)  SpO2: 98% 98%    Jeffrey Charles

## 2019-08-26 IMAGING — CT CT ABDOMEN AND PELVIS WITH CONTRAST
2 of 5 series · 16 of 46 positions shown, 18 images · IV contrast (APPLIED)
Comparison: 03/30/2012

CLINICAL DATA: LEFT lower quadrant pain since last [REDACTED],
burning sensation, severe cramping at times, diarrhea since [REDACTED]

EXAM:
CT ABDOMEN AND PELVIS WITH CONTRAST
TECHNIQUE: Multidetector CT imaging of the abdomen and pelvis was performed
using the standard protocol following bolus administration of
intravenous contrast. Sagittal and coronal MPR images reconstructed
from axial data set.
CONTRAST:  100mL OMNIPAQUE IOHEXOL 300 MG/ML SOLN IV. No oral
contrast.

[Series 2: routine abd/pel with · axial · 0.75mm/px · z∈[-921,-486]mm · 13 of 97 slices shown, 15 images]
[im 5/97  soft-tissue]
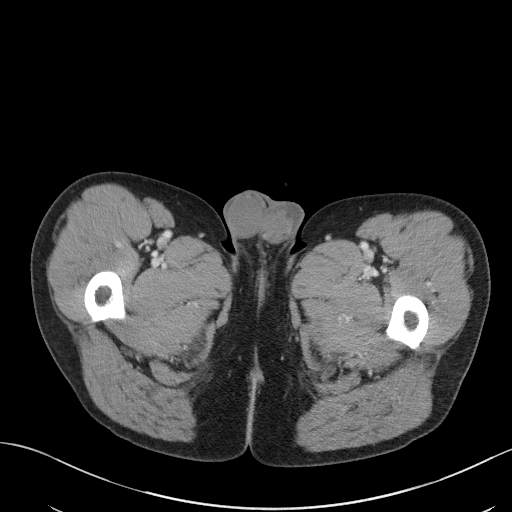
[im 5/97  bone]
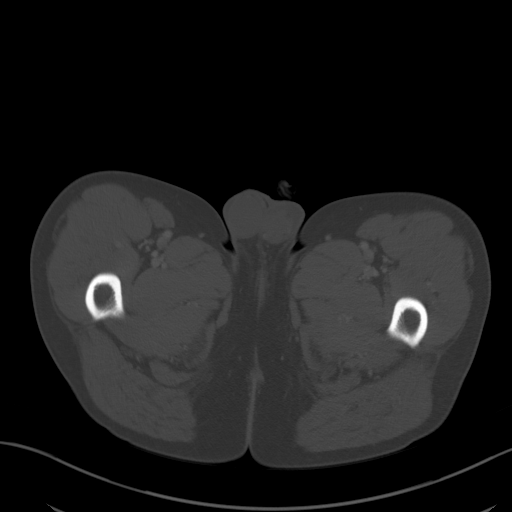
[im 15/97  soft-tissue]
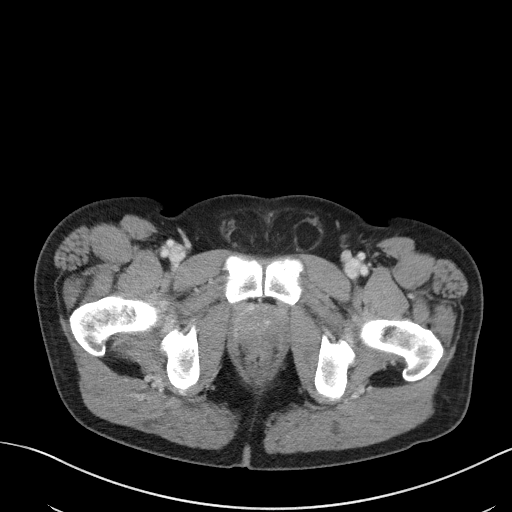
[im 20/97  soft-tissue]
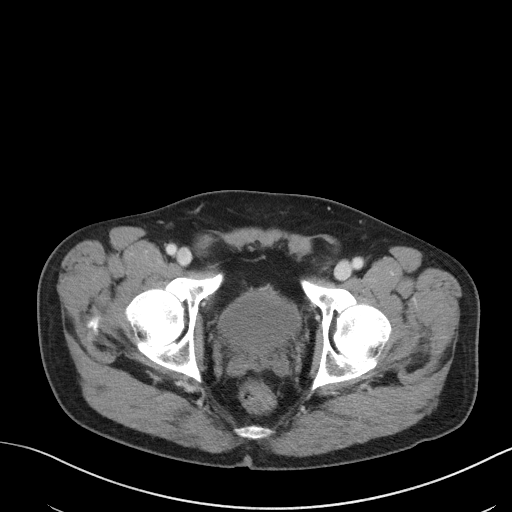
[im 29/97  soft-tissue]
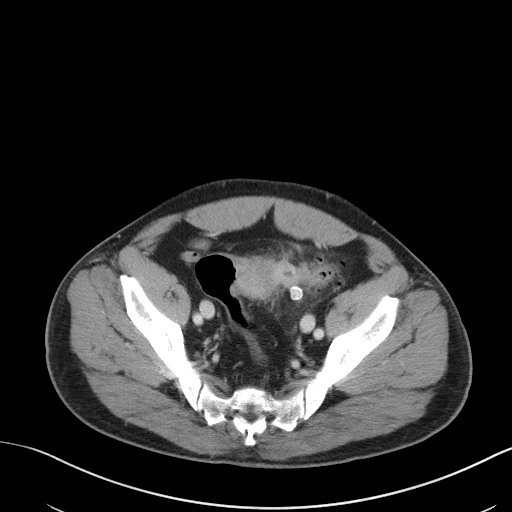
[im 34/97  soft-tissue]
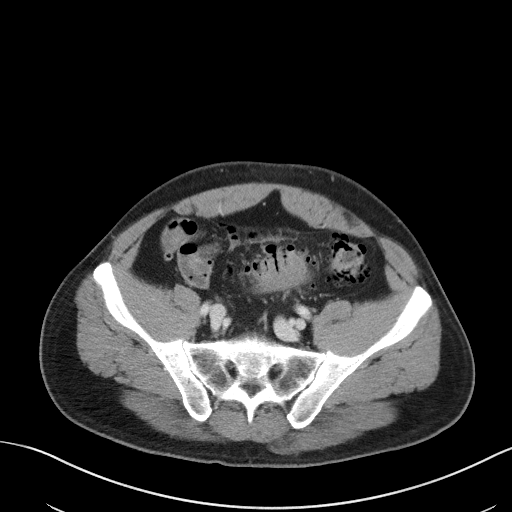
[im 44/97  soft-tissue]
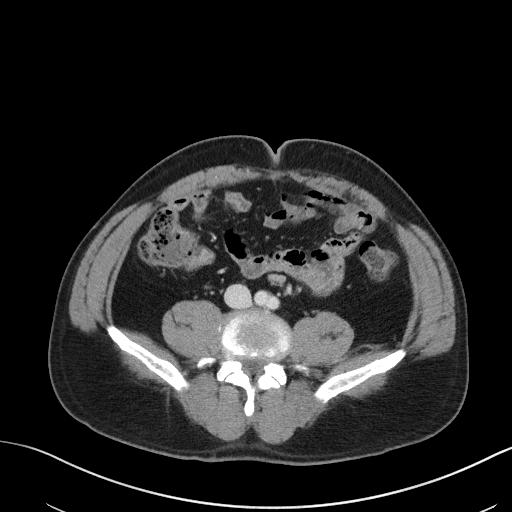
[im 49/97  soft-tissue]
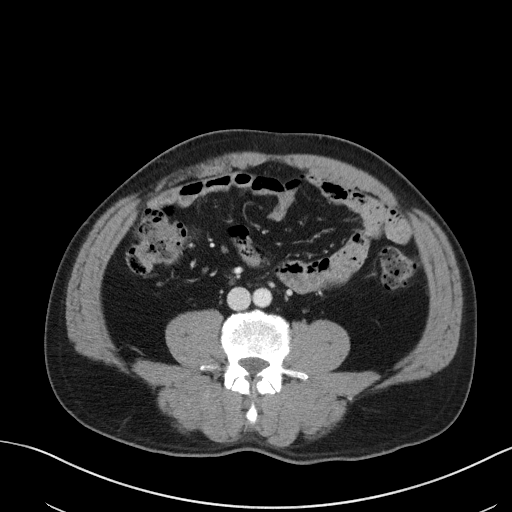
[im 53/97  soft-tissue]
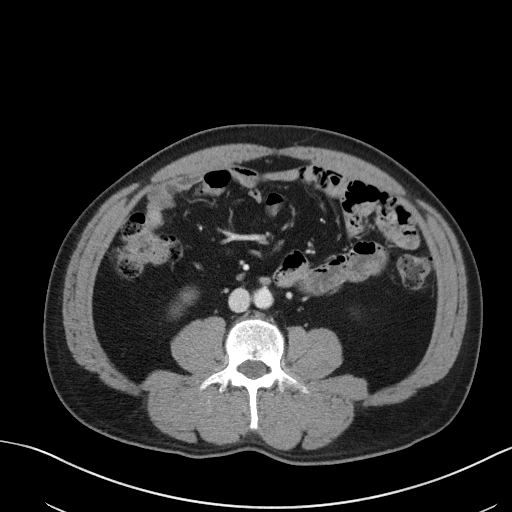
[im 63/97  soft-tissue]
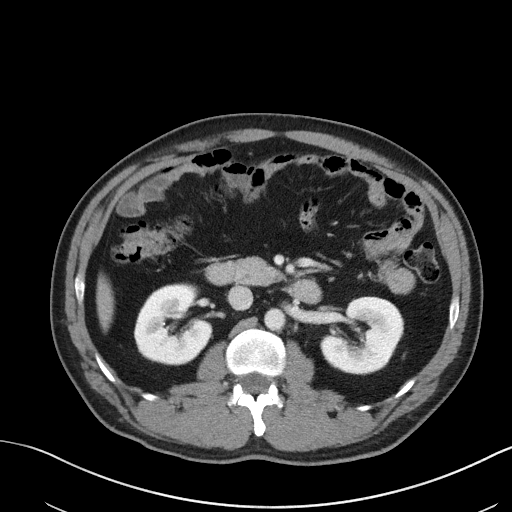
[im 63/97  bone]
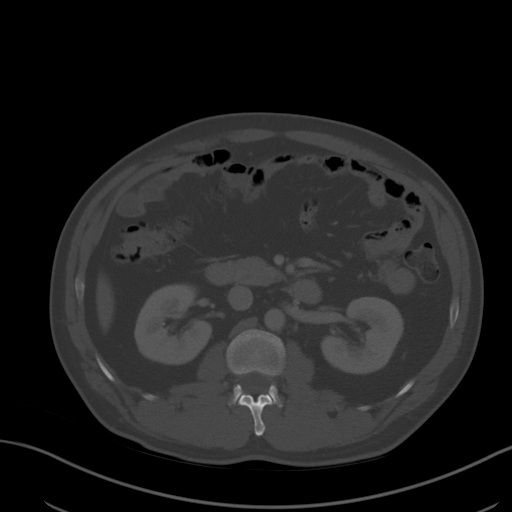
[im 68/97  soft-tissue]
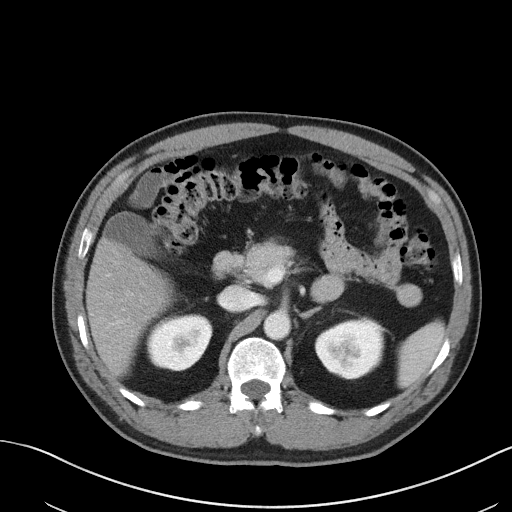
[im 77/97  soft-tissue]
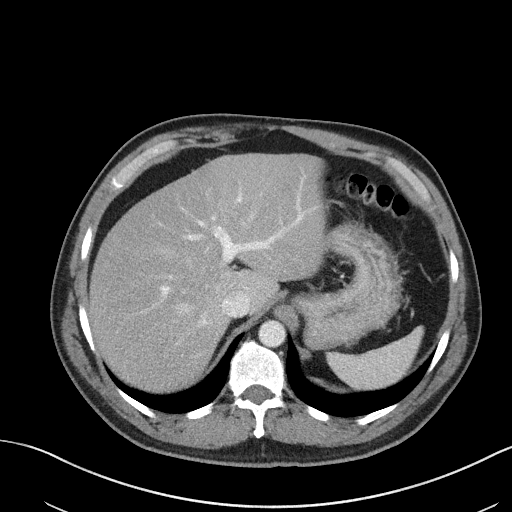
[im 82/97  soft-tissue]
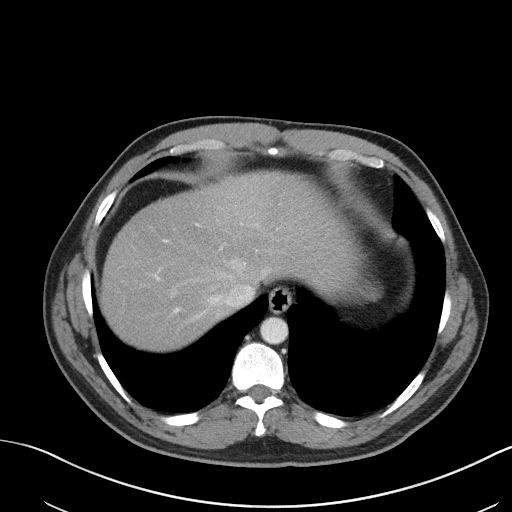
[im 92/97  soft-tissue]
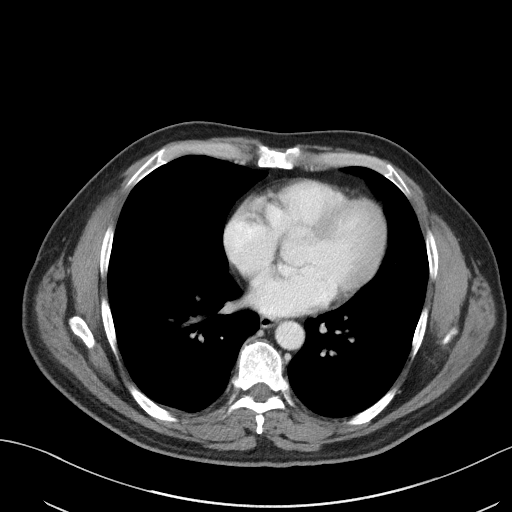

[Series 6: coronal st · coronal · 0.76mm/px · 3 of 84 slices shown]
[im 28/84  soft-tissue]
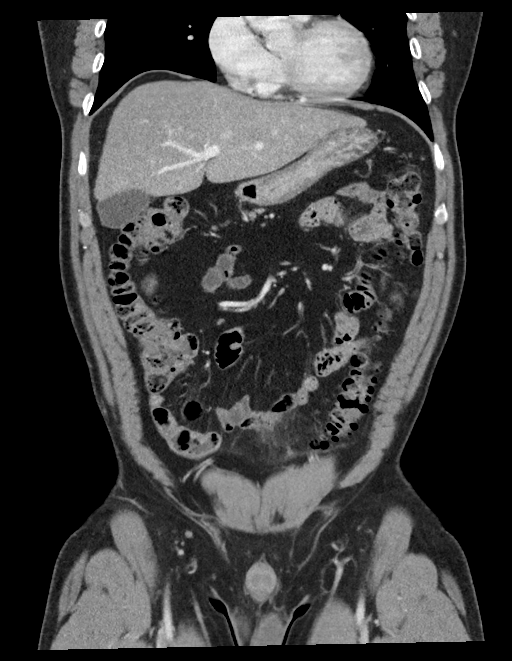
[im 37/84  soft-tissue]
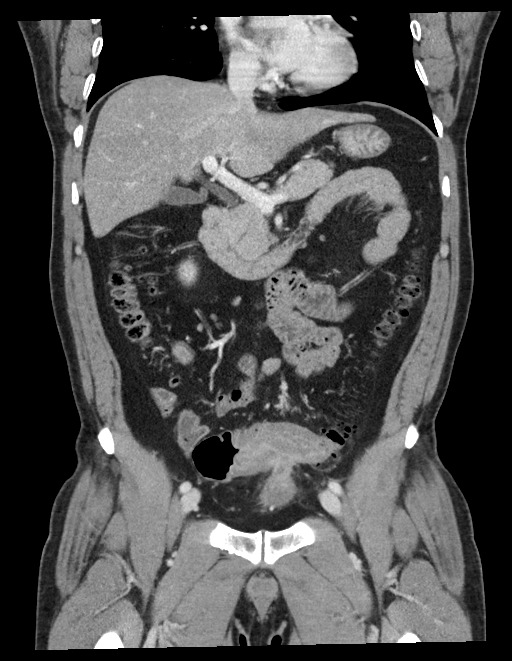
[im 47/84  soft-tissue]
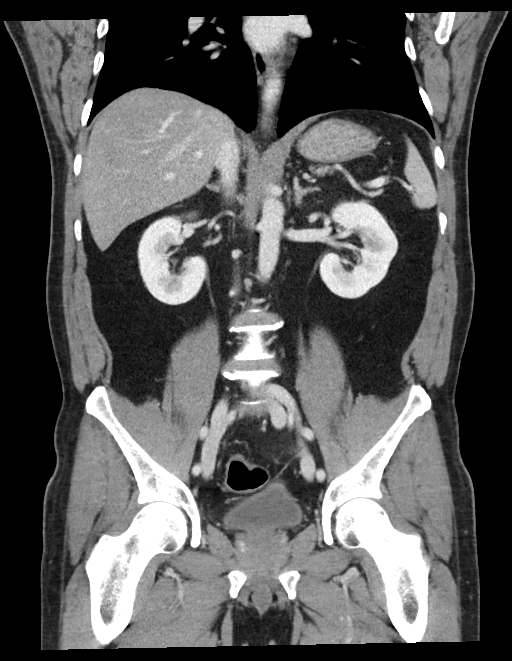

[16 of 46 positions shown; findings below may reference images not displayed]

FINDINGS: Lower chest: Calcified granuloma laterally at RIGHT lung base. Lung
bases otherwise clear.

Hepatobiliary: Mild fatty infiltration of liver. Liver and
gallbladder otherwise normal appearance.

Pancreas: Normal appearance

Spleen: Normal appearance

Adrenals/Urinary Tract: Tiny cyst inferior pole RIGHT kidney.
Adrenal glands, kidneys, ureters, and bladder normal appearance

Stomach/Bowel: Normal appendix. Diverticulosis of sigmoid colon with
sigmoid wall thickening and pericolic inflammatory changes
compatible with acute diverticulitis. Small fluid collection
measuring 9 x 15 x 8 mm mm in size with surrounding wall thickening
and enhancement identified adjacent to the sigmoid colon and
extending to the dome of the urinary bladder consistent with small
diverticular abscess. Associated wall thickening of the adjacent
bladder dome. No additional extraluminal fluid collections. No
extraluminal gas or free intraperitoneal air. Stomach and remaining
bowel loops unremarkable.

Vascular/Lymphatic: Aorta normal caliber.  No adenopathy.

Reproductive: Minimal prostatic enlargement.

Other: Small LEFT inguinal hernia containing fat. No free air or
free fluid. Tiny umbilical hernia containing fat.

Musculoskeletal: No acute osseous findings.
IMPRESSION: Acute sigmoid diverticulitis with a small diverticular abscess
collection 9 x 15 x 8 mm in size located between the sigmoid colon
and the dome of the urinary bladder.

No free intraperitoneal air.

Small LEFT inguinal and tiny umbilical hernias containing fat.

## 2022-02-06 ENCOUNTER — Emergency Department
Admission: EM | Admit: 2022-02-06 | Discharge: 2022-02-06 | Disposition: A | Discharge status: Home / Self Care | Payer: No Typology Code available for payment source | Attending: Emergency Medicine | Admitting: Emergency Medicine

## 2022-02-06 ENCOUNTER — Other Ambulatory Visit: Payer: Self-pay

## 2022-02-06 ENCOUNTER — Emergency Department: Payer: No Typology Code available for payment source

## 2022-02-06 DIAGNOSIS — K5792 Diverticulitis of intestine, part unspecified, without perforation or abscess without bleeding: Secondary | ICD-10-CM | POA: Diagnosis not present

## 2022-02-06 DIAGNOSIS — R1032 Left lower quadrant pain: Secondary | ICD-10-CM | POA: Diagnosis present

## 2022-02-06 LAB — URINALYSIS, ROUTINE W REFLEX MICROSCOPIC
Bacteria, UA: NONE SEEN
Bilirubin Urine: NEGATIVE
Glucose, UA: >=500 mg/dL — AB
Hgb urine dipstick: NEGATIVE
Ketones, ur: NEGATIVE mg/dL
Leukocytes,Ua: NEGATIVE
Nitrite: NEGATIVE
Protein, ur: NEGATIVE mg/dL
Specific Gravity, Urine: 1.024 (ref 1.005–1.030)
pH: 5 (ref 5.0–8.0)

## 2022-02-06 LAB — CBC
HCT: 46.3 % (ref 39.0–52.0)
Hemoglobin: 15.9 g/dL (ref 13.0–17.0)
MCH: 29.2 pg (ref 26.0–34.0)
MCHC: 34.3 g/dL (ref 30.0–36.0)
MCV: 85.1 fL (ref 80.0–100.0)
Platelets: 244 10*3/uL (ref 150–400)
RBC: 5.44 MIL/uL (ref 4.22–5.81)
RDW: 12.3 % (ref 11.5–15.5)
WBC: 5.5 10*3/uL (ref 4.0–10.5)
nRBC: 0 % (ref 0.0–0.2)

## 2022-02-06 LAB — COMPREHENSIVE METABOLIC PANEL
ALT: 33 U/L (ref 0–44)
AST: 26 U/L (ref 15–41)
Albumin: 4 g/dL (ref 3.5–5.0)
Alkaline Phosphatase: 88 U/L (ref 38–126)
Anion gap: 7 (ref 5–15)
BUN: 18 mg/dL (ref 6–20)
CO2: 24 mmol/L (ref 22–32)
Calcium: 8.9 mg/dL (ref 8.9–10.3)
Chloride: 106 mmol/L (ref 98–111)
Creatinine, Ser: 0.79 mg/dL (ref 0.61–1.24)
GFR, Estimated: >60 mL/min (ref >60)
Glucose, Bld: 164 mg/dL — ABNORMAL HIGH (ref 70–99)
Potassium: 3.6 mmol/L (ref 3.5–5.1)
Sodium: 137 mmol/L (ref 135–145)
Total Bilirubin: 0.7 mg/dL (ref 0.3–1.2)
Total Protein: 7.2 g/dL (ref 6.5–8.1)

## 2022-02-06 LAB — LIPASE, BLOOD: Lipase: 36 U/L (ref 11–51)

## 2022-02-06 MED ORDER — FENTANYL CITRATE PF 50 MCG/ML IJ SOSY
50.0000 ug | PREFILLED_SYRINGE | Freq: Once | INTRAMUSCULAR | Status: AC
  Administered 2022-02-06: 50 ug via INTRAVENOUS
  Filled 2022-02-06: qty 1

## 2022-02-06 MED ORDER — ONDANSETRON HCL 4 MG/2ML IJ SOLN
4.0000 mg | Freq: Once | INTRAMUSCULAR | Status: AC
  Administered 2022-02-06: 4 mg via INTRAVENOUS
  Filled 2022-02-06: qty 2

## 2022-02-06 MED ORDER — AMOXICILLIN-POT CLAVULANATE 875-125 MG PO TABS: 1.0000 | ORAL_TABLET | Freq: Two times a day (BID) | ORAL | 0 refills | Status: AC

## 2022-02-06 MED ORDER — IOHEXOL 300 MG/ML  SOLN
100.0000 mL | Freq: Once | INTRAMUSCULAR | Status: AC | PRN
  Administered 2022-02-06: 100 mL via INTRAVENOUS

## 2022-02-06 MED ORDER — SODIUM CHLORIDE 0.9 % IV BOLUS
1000.0000 mL | Freq: Once | INTRAVENOUS | Status: AC
  Administered 2022-02-06: 1000 mL via INTRAVENOUS

## 2022-02-06 NOTE — ED Triage Notes (Signed)
Pt states he is having pain in his LLQ that is a burning sensation x2 weeks- pt states that Thursday night it got worse- pt denies n/v/d and fever- pt has had diverticulosis before and this feels similar

## 2022-02-06 NOTE — ED Notes (Signed)
Pt up to bedside toilet.

## 2022-02-06 NOTE — ED Notes (Signed)
Interpreter notified this RN he is at bedside; this RN in emergency but will be to bedside asap.

## 2022-02-06 NOTE — ED Notes (Signed)
Interpreter assisting this RN to assess pt. Pt denies tenderness, N/V/D, constipation, changes to urination; pt confirms has been able to eat/drink okay. Burning sensation started Thursday afternoon per pt. Pt reports pain has been constant since it started; denies major medical conditions other than gastritis, changes to diet; pt states sometimes post coffee he experiences this pain.

## 2022-02-06 NOTE — ED Provider Notes (Signed)
[facility] Provider Note    Event Date/Time   First MD Initiated Contact with Patient 02/06/22 1116     (approximate)   History   Abdominal Pain   HPI  Jeffrey Charles is a 52 y.o. male who presents to the emergency department today because of concerns for left lower quadrant abdominal pain.  The pain started roughly 2 weeks ago.  It has not moved in location.  Initially started more as itching and is now more burning in nature.  He has not had any fevers.  Denies any associated nausea vomiting or diarrhea.  He had similar pain a couple of years ago and was diagnosed with diverticulitis.  He says at that time he had to have part of his colon removed.     Physical Exam   Triage Vital Signs: ED Triage Vitals  Enc Vitals Group     BP 02/06/22 1004 133/87     Pulse Rate 02/06/22 1004 71     Resp 02/06/22 1004 16     Temp 02/06/22 1004 97.6 F (36.4 C)     Temp src --      SpO2 02/06/22 1004 96 %     Weight 02/06/22 1004 150 lb (68 kg)     Height 02/06/22 1004 5' (1.524 m)     Head Circumference --      Peak Flow --      Pain Score 02/06/22 1023 4     Pain Loc --      Pain Edu? --      Excl. in [place]? --     Most recent vital signs: Vitals:   02/06/22 1004  BP: 133/87  Pulse: 71  Resp: 16  Temp: 97.6 F (36.4 C)  SpO2: 96%   General: Awake, alert, oriented. CV:  Good peripheral perfusion. Regular rate and rhythm. Resp:  Normal effort. Lungs clear. Abd:  No distention. Tender to palpation in the left lower quadrant.   ED Results / Procedures / Treatments   Labs (all labs ordered are listed, but only abnormal results are displayed) Labs Reviewed  COMPREHENSIVE METABOLIC PANEL - Abnormal; Notable for the following components:      Result Value   Glucose, Bld 164 (*)    All other components within normal limits  URINALYSIS, ROUTINE W REFLEX MICROSCOPIC - Abnormal; Notable for the following components:   Color, Urine YELLOW  (*)    APPearance CLEAR (*)    Glucose, UA >=500 (*)    All other components within normal limits  LIPASE, BLOOD  CBC     EKG  None   RADIOLOGY I independently interpreted and visualized the CT abd/pel. My interpretation: No free air Radiology interpretation:  IMPRESSION:  1. Multiple colonic diverticula with small amount of adjacent  mesenteric stranding in the left lower quadrant, possibly  representing chronic diverticular disease. Mild acute component is  difficult to exclude.  2. Cholelithiasis without evidence of acute cholecystitis.  3. Small fat containing left inguinal hernia.     PROCEDURES:  Critical Care performed: No  Procedures   MEDICATIONS ORDERED IN ED: Medications - No data to display   IMPRESSION / MDM / [place] / ED COURSE  I reviewed the triage vital signs and the nursing notes.                              Differential diagnosis includes, but is  not limited to, diverticulitis, kidney stone, UTI, gastroenteritis.  Patient's presentation is most consistent with acute presentation with potential threat to life or bodily function.   Patient presented to the emergency department today because of concerns for left lower quadrant pain.  States he has history of diverticulitis.  On exam he is tender in the left lower quadrant.  Blood work without concerning leukocytosis.  CT scan was obtained which was concerning for possible diverticulitis.  I do think this is likely the etiology given that the patient has had similar pain with previous episodes of diverticulitis.  Will plan on starting on antibiotics.     FINAL CLINICAL IMPRESSION(S) / ED DIAGNOSES   Final diagnoses:  Diverticulitis    Note:  This document was prepared using Dragon voice recognition software and may include unintentional dictation errors.    Nance Pear, MD 02/06/22 1524

## 2022-02-06 NOTE — ED Notes (Addendum)
interpreter request placed as interpreting system in ER remains down for maintenance. Will further assess pt once interpreter available to assist.

## 2022-02-06 NOTE — Discharge Instructions (Addendum)
Please seek medical attention for any high fevers, chest pain, shortness of breath, change in behavior, persistent vomiting, bloody stool or any other new or concerning symptoms.  

## 2022-02-06 NOTE — ED Notes (Signed)
Pt leaving for imaging.

## 2022-02-06 NOTE — ED Notes (Signed)
See triage note, pt currently reporting "burning" sensation in LLQ to this RN. Pt denies tenderness when palpated. Pt's resp reg/unlabored, skin dry and calmly laying on stretcher.

## 2022-07-20 ENCOUNTER — Encounter: Payer: Self-pay | Admitting: *Deleted

## 2022-07-21 ENCOUNTER — Encounter: Payer: Self-pay | Admitting: *Deleted

## 2022-07-27 ENCOUNTER — Encounter
Admission: RE | Disposition: A | Discharge status: Home / Self Care | Payer: Self-pay | Source: Home / Self Care | Attending: Gastroenterology

## 2022-07-27 ENCOUNTER — Ambulatory Visit: Payer: No Typology Code available for payment source | Admitting: Anesthesiology

## 2022-07-27 ENCOUNTER — Ambulatory Visit
Admission: RE | Admit: 2022-07-27 | Discharge: 2022-07-27 | Disposition: A | Discharge status: Home / Self Care | Payer: No Typology Code available for payment source | Attending: Gastroenterology | Admitting: Gastroenterology

## 2022-07-27 ENCOUNTER — Encounter: Payer: Self-pay | Admitting: *Deleted

## 2022-07-27 DIAGNOSIS — Z980 Intestinal bypass and anastomosis status: Secondary | ICD-10-CM | POA: Diagnosis not present

## 2022-07-27 DIAGNOSIS — K6389 Other specified diseases of intestine: Secondary | ICD-10-CM | POA: Diagnosis not present

## 2022-07-27 DIAGNOSIS — K5730 Diverticulosis of large intestine without perforation or abscess without bleeding: Secondary | ICD-10-CM | POA: Diagnosis not present

## 2022-07-27 DIAGNOSIS — K640 First degree hemorrhoids: Secondary | ICD-10-CM | POA: Diagnosis not present

## 2022-07-27 DIAGNOSIS — Z09 Encounter for follow-up examination after completed treatment for conditions other than malignant neoplasm: Secondary | ICD-10-CM | POA: Diagnosis present

## 2022-07-27 HISTORY — DX: Prediabetes: R73.03

## 2022-07-27 HISTORY — DX: Personal history of other diseases of the digestive system: Z87.19

## 2022-07-27 HISTORY — PX: COLONOSCOPY WITH PROPOFOL: SHX5780

## 2022-07-27 HISTORY — DX: Allergic rhinitis, unspecified: J30.9

## 2022-07-27 SURGERY — COLONOSCOPY WITH PROPOFOL
Anesthesia: General

## 2022-07-27 MED ORDER — PROPOFOL 1000 MG/100ML IV EMUL
INTRAVENOUS | Status: AC
  Filled 2022-07-27: qty 100

## 2022-07-27 MED ORDER — PROPOFOL 10 MG/ML IV BOLUS
INTRAVENOUS | Status: DC | PRN
  Administered 2022-07-27: 20 mg via INTRAVENOUS
  Administered 2022-07-27: 100 mg via INTRAVENOUS

## 2022-07-27 MED ORDER — SIMETHICONE 40 MG/0.6ML PO SUSP
ORAL | Status: DC | PRN
  Administered 2022-07-27: 60 mL

## 2022-07-27 MED ORDER — LIDOCAINE HCL (PF) 2 % IJ SOLN
INTRAMUSCULAR | Status: AC
  Filled 2022-07-27: qty 5

## 2022-07-27 MED ORDER — PROPOFOL 500 MG/50ML IV EMUL
INTRAVENOUS | Status: DC | PRN
  Administered 2022-07-27: 100 ug/kg/min via INTRAVENOUS

## 2022-07-27 MED ORDER — LIDOCAINE HCL (CARDIAC) PF 100 MG/5ML IV SOSY
PREFILLED_SYRINGE | INTRAVENOUS | Status: DC | PRN
  Administered 2022-07-27: 50 mg via INTRAVENOUS

## 2022-07-27 MED ORDER — SODIUM CHLORIDE 0.9 % IV SOLN: INTRAVENOUS | Status: DC

## 2022-07-27 NOTE — H&P (Signed)
Outpatient short stay form Pre-procedure 07/27/2022  Regis Bill, MD  Primary Physician: Patient, No Pcp Per  Reason for visit:  History of diverticulitis  History of present illness:    52 y/o gentleman with history of complicated diverticulitis of colon requiring sigmoidectomy who is here for f/u colonoscopy of diverticulitis in January. Last colonoscopy in 2021 with inflammatory mass. No blood thinners. No family history of GI malignancies. No abdominal pain currently.    Current Facility-Administered Medications:    0.9 %  sodium chloride infusion, , Intravenous, Continuous, Sahar Ryback, Rossie Muskrat, MD, Last Rate: 20 mL/hr at 07/27/22 0841, New Bag at 07/27/22 0841  No medications prior to admission.     No Known Allergies   Past Medical History:  Diagnosis Date   Allergic rhinitis    Diverticulitis 06/2018   Heart murmur    as a teenager    History of diverticulitis of colon    Pre-diabetes     Review of systems:  Otherwise negative.    Physical Exam  Gen: Alert, oriented. Appears stated age.  HEENT: PERRLA. Lungs: No respiratory distress CV: RRR Abd: soft, benign, no masses Ext: No edema    Planned procedures: Proceed with colonoscopy. The patient understands the nature of the planned procedure, indications, risks, alternatives and potential complications including but not limited to bleeding, infection, perforation, damage to internal organs and possible oversedation/side effects from anesthesia. The patient agrees and gives consent to proceed.  Please refer to procedure notes for findings, recommendations and patient disposition/instructions.     Regis Bill, MD [facility]

## 2022-07-27 NOTE — Anesthesia Postprocedure Evaluation (Signed)
Anesthesia Post Note  Patient: Jeffrey Charles  Procedure(s) Performed: COLONOSCOPY WITH PROPOFOL  Patient location during evaluation: Endoscopy Anesthesia Type: General Level of consciousness: awake and alert Pain management: pain level controlled Vital Signs Assessment: post-procedure vital signs reviewed and stable Respiratory status: spontaneous breathing, nonlabored ventilation and respiratory function stable Cardiovascular status: blood pressure returned to baseline and stable Postop Assessment: no apparent nausea or vomiting Anesthetic complications: no   No notable events documented.   Last Vitals:  Vitals:   07/27/22 1010 07/27/22 1020  BP: 106/73 120/75  Pulse:    Resp:    Temp:    SpO2:      Last Pain:  Vitals:   07/27/22 1020  TempSrc:   PainSc: 0-No pain                 Foye Deer

## 2022-07-27 NOTE — Transfer of Care (Signed)
Immediate Anesthesia Transfer of Care Note  Patient: Jeffrey Charles  Procedure(s) Performed: COLONOSCOPY WITH PROPOFOL  Patient Location: PACU  Anesthesia Type:General  Level of Consciousness: drowsy and responds to stimulation  Airway & Oxygen Therapy: Patient Spontanous Breathing  Post-op Assessment: Report given to RN and Post -op Vital signs reviewed and stable  Post vital signs: Reviewed and stable  Last Vitals:  Vitals Value Taken Time  BP 102/67   Temp    Pulse 63 07/27/22 1005  Resp 16 07/27/22 1005  SpO2 97 % 07/27/22 1005  Vitals shown include unvalidated device data.  Last Pain:  Vitals:   07/27/22 0817  TempSrc: Temporal         Complications: No notable events documented.

## 2022-07-27 NOTE — Op Note (Signed)
[facility] Gastroenterology Patient Name: Jeffrey Charles Procedure Date: 07/27/2022 9:34 AM MRN: [id number] Account #: [id number] Date of Birth: 01/28/1970 Admit Type: Outpatient Age: 52 Room: [facility] ENDO ROOM 1 Gender: Male Note Status: Finalized Instrument Name: Colonoscope [id number] Procedure:             Colonoscopy Indications:           Follow-up of diverticulitis Providers:             Eather Colas MD, MD Referring MD:          No Local Md, MD (Referring MD) Medicines:             Monitored Anesthesia Care Complications:         No immediate complications. Procedure:             Pre-Anesthesia Assessment:                        - Prior to the procedure, a History and Physical was                         performed, and patient medications and allergies were                         reviewed. The patient is competent. The risks and                         benefits of the procedure and the sedation options and                         risks were discussed with the patient. All questions                         were answered and informed consent was obtained.                         Patient identification and proposed procedure were                         verified by the physician, the nurse, the                         anesthesiologist, the anesthetist and the technician                         in the endoscopy suite. Mental Status Examination:                         alert and oriented. Airway Examination: normal                         oropharyngeal airway and neck mobility. Respiratory                         Examination: clear to auscultation. CV Examination:                         normal. Prophylactic Antibiotics: The patient does not  require prophylactic antibiotics. Prior                         Anticoagulants: The patient has taken no anticoagulant                         or antiplatelet agents. ASA Grade Assessment:  I - A                         normal, healthy patient. After reviewing the risks and                         benefits, the patient was deemed in satisfactory                         condition to undergo the procedure. The anesthesia                         plan was to use monitored anesthesia care (MAC).                         Immediately prior to administration of medications,                         the patient was re-assessed for adequacy to receive                         sedatives. The heart rate, respiratory rate, oxygen                         saturations, blood pressure, adequacy of pulmonary                         ventilation, and response to care were monitored                         throughout the procedure. The physical status of the                         patient was re-assessed after the procedure.                        After obtaining informed consent, the colonoscope was                         passed under direct vision. Throughout the procedure,                         the patient's blood pressure, pulse, and oxygen                         saturations were monitored continuously. The                         Colonoscope was introduced through the anus and                         advanced to the the cecum, identified by appendiceal  orifice and ileocecal valve. The colonoscopy was                         performed without difficulty. The patient tolerated                         the procedure well. The quality of the bowel                         preparation was good. The ileocecal valve, appendiceal                         orifice, and rectum were photographed. Findings:      The perianal and digital rectal examinations were normal.      Multiple small-mouthed diverticula were found in the sigmoid colon,       descending colon, splenic flexure, transverse colon, hepatic flexure and       ascending colon.      There was evidence of a prior  end-to-end colo-colonic anastomosis in the       sigmoid colon. This was patent and was characterized by healthy       appearing mucosa.      One 4 mm nodule was found in the sigmoid colon. It was within a       diverticulum and appeared either as inflammatory polyp or hyperplastic.       Given location within a diverticulum, removal was not attempted.      A tattoo was seen in the sigmoid colon.      Internal hemorrhoids were found during retroflexion. The hemorrhoids       were Grade I (internal hemorrhoids that do not prolapse).      The exam was otherwise without abnormality on direct and retroflexion       views. Impression:            - Diverticulosis in the sigmoid colon, in the                         descending colon, at the splenic flexure, in the                         transverse colon, at the hepatic flexure and in the                         ascending colon.                        - Patent end-to-end colo-colonic anastomosis,                         characterized by healthy appearing mucosa.                        - Nodule in the sigmoid colon.                        - A tattoo was seen in the sigmoid colon.                        - Internal hemorrhoids.                        -  The examination was otherwise normal on direct and                         retroflexion views.                        - No specimens collected. Recommendation:        - Discharge patient to home.                        - Resume previous diet.                        - Continue present medications.                        - Repeat colonoscopy in 5 years for screening purposes.                        - Return to referring physician as previously                         scheduled. Procedure Code(s):     --- Professional ---                        [phone], Colonoscopy, flexible; diagnostic, including                         collection of specimen(s) by brushing or washing, when                          performed (separate procedure) Diagnosis Code(s):     --- Professional ---                        K64.0, First degree hemorrhoids                        Z98.0, Intestinal bypass and anastomosis status                        K63.89, Other specified diseases of intestine                        K57.32, Diverticulitis of large intestine without                         perforation or abscess without bleeding                        K57.30, Diverticulosis of large intestine without                         perforation or abscess without bleeding CPT copyright 2022 [employer]. All rights reserved. The codes documented in this report are preliminary and upon coder review may  be revised to meet current compliance requirements. Eather Colas MD, MD 07/27/2022 10:08:48 AM Number of Addenda: 0 Note Initiated On: 07/27/2022 9:34 AM Scope Withdrawal Time: 0 hours 9 minutes 41 seconds  Total Procedure Duration: 0 hours 11 minutes 39 seconds  Estimated Blood Loss:  Estimated blood loss: none.  [facility]

## 2022-07-27 NOTE — Interval H&P Note (Signed)
History and Physical Interval Note:  07/27/2022 9:10 AM  Jeffrey Charles  has presented today for surgery, with the diagnosis of H/O Diverticulitis.  The various methods of treatment have been discussed with the patient and family. After consideration of risks, benefits and other options for treatment, the patient has consented to  Procedure(s) with comments: COLONOSCOPY WITH PROPOFOL (N/A) - SPANISH INTERPRETER as a surgical intervention.  The patient's history has been reviewed, patient examined, no change in status, stable for surgery.  I have reviewed the patient's chart and labs.  Questions were answered to the patient's satisfaction.     Regis Bill  Ok to proceed with colonoscopy

## 2022-07-27 NOTE — Anesthesia Preprocedure Evaluation (Addendum)
Anesthesia Evaluation  Patient identified by MRN, date of birth, ID band Patient awake    Reviewed: Allergy & Precautions, H&P , NPO status , Patient's Chart, lab work & pertinent test results  Airway Mallampati: II  TM Distance: >3 FB Neck ROM: full    Dental no notable dental hx.    Pulmonary neg pulmonary ROS   Pulmonary exam normal        Cardiovascular Normal cardiovascular exam+ Valvular Problems/Murmurs (murmur at 52 yo, no symptoms)      Neuro/Psych negative neurological ROS  negative psych ROS   GI/Hepatic Neg liver ROS,,,H/O Diverticulitis   Endo/Other  negative endocrine ROS    Renal/GU negative Renal ROS  negative genitourinary   Musculoskeletal   Abdominal Normal abdominal exam  (+)   Peds  Hematology negative hematology ROS (+)   Anesthesia Other Findings Past Medical History: No date: Allergic rhinitis 06/2018: Diverticulitis No date: Heart murmur     Comment:  as a teenager  No date: History of diverticulitis of colon No date: Pre-diabetes  Past Surgical History: 05/07/2019: COLON SURGERY     Comment:  dr. Lurline Idol cintron-diaz 04/05/2019: COLONOSCOPY WITH PROPOFOL; N/A     Comment:  Procedure: COLONOSCOPY WITH PROPOFOL;  Surgeon: [place],               Boykin Nearing, MD;  Location: [facility] ENDOSCOPY;  Service:               Gastroenterology;  Laterality: N/A;     Reproductive/Obstetrics negative OB ROS                              Anesthesia Physical Anesthesia Plan  ASA: 1  Anesthesia Plan: General   Post-op Pain Management:    Induction: Intravenous  PONV Risk Score and Plan: Propofol infusion and TIVA  Airway Management Planned: Natural Airway  Additional Equipment:   Intra-op Plan:   Post-operative Plan:   Informed Consent: I have reviewed the patients History and Physical, chart, labs and discussed the procedure including the risks, benefits and  alternatives for the proposed anesthesia with the patient or authorized representative who has indicated his/her understanding and acceptance.     Dental Advisory Given  Plan Discussed with: CRNA and Surgeon  Anesthesia Plan Comments:          Anesthesia Quick Evaluation
